# Patient Record
Sex: Female | Born: 1985 | Race: White | Hispanic: No | Marital: Married | Smoking: Never smoker
Health system: Southern US, Community
[De-identification: ages and names within clinical notes are randomized; demographics above are authoritative.]

## PROBLEM LIST (undated history)

## (undated) ENCOUNTER — Inpatient Hospital Stay: Payer: Self-pay

## (undated) DIAGNOSIS — Q613 Polycystic kidney, unspecified: Secondary | ICD-10-CM

## (undated) DIAGNOSIS — I10 Essential (primary) hypertension: Secondary | ICD-10-CM

## (undated) DIAGNOSIS — O24419 Gestational diabetes mellitus in pregnancy, unspecified control: Secondary | ICD-10-CM

## (undated) HISTORY — DX: Essential (primary) hypertension: I10

## (undated) HISTORY — DX: Polycystic kidney, unspecified: Q61.3

---

## 2007-02-22 ENCOUNTER — Emergency Department: Payer: Self-pay | Admitting: Emergency Medicine

## 2007-02-28 ENCOUNTER — Emergency Department: Payer: Self-pay | Admitting: Emergency Medicine

## 2009-07-03 IMAGING — CT CT STONE STUDY
1 of 2 series · 15 of 32 positions shown, 19 images · non-contrast
Comparison: none

REASON FOR EXAM: Abdominal pain
COMMENTS:

[Series 2: stone · axial · 0.71mm/px · z∈[-64,+374]mm · 15 of 164 slices shown, 19 images]
[im 12/164  soft-tissue]
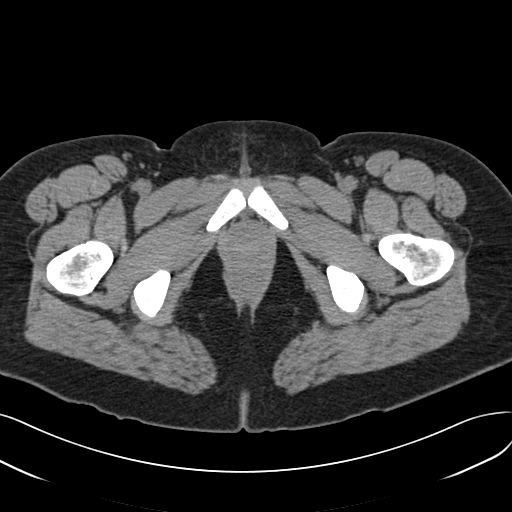
[im 12/164  bone]
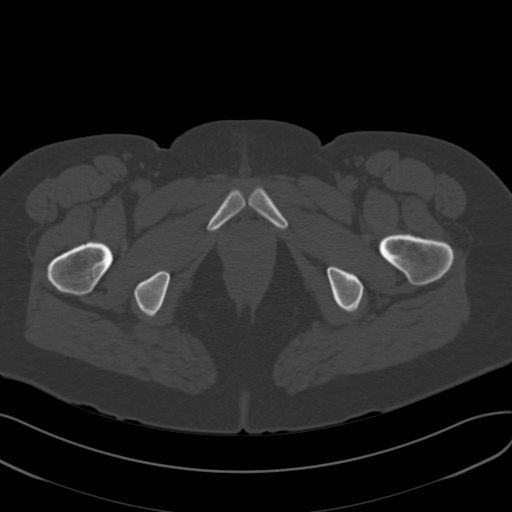
[im 23/164  soft-tissue]
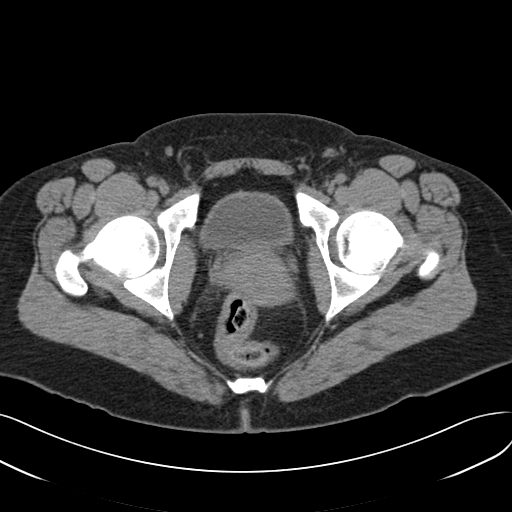
[im 34/164  soft-tissue]
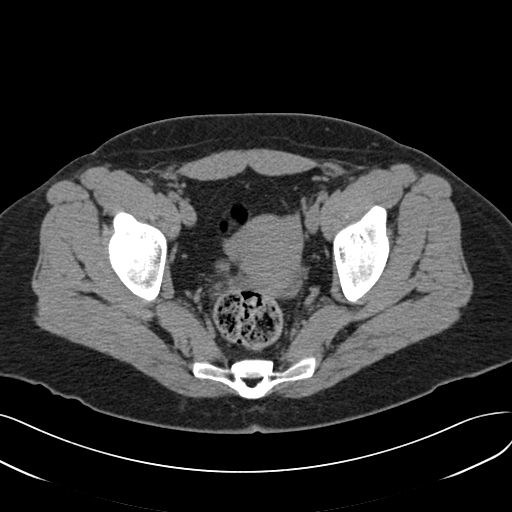
[im 45/164  soft-tissue]
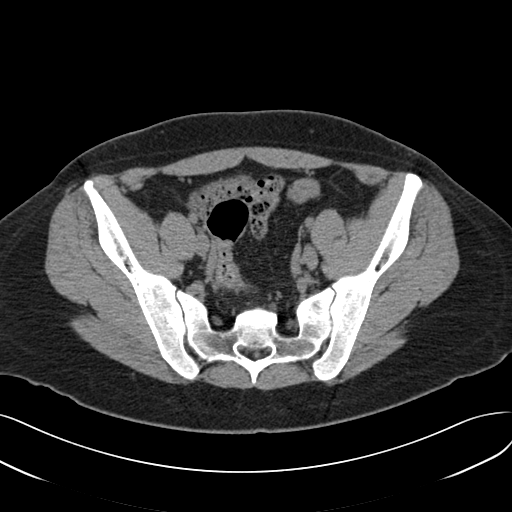
[im 57/164  soft-tissue]
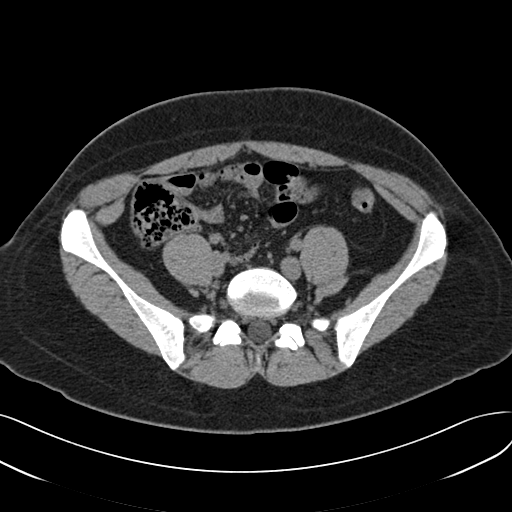
[im 68/164  soft-tissue]
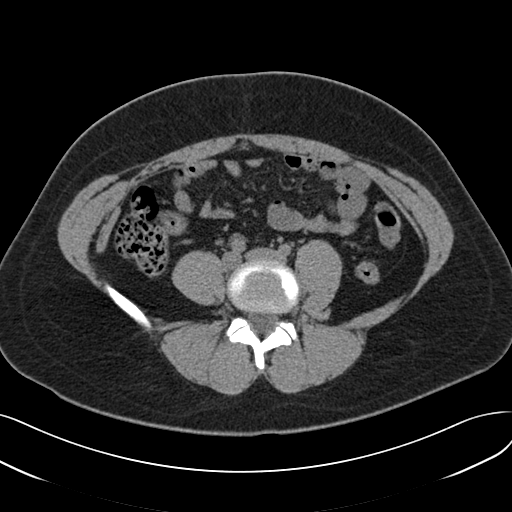
[im 85/164  soft-tissue]
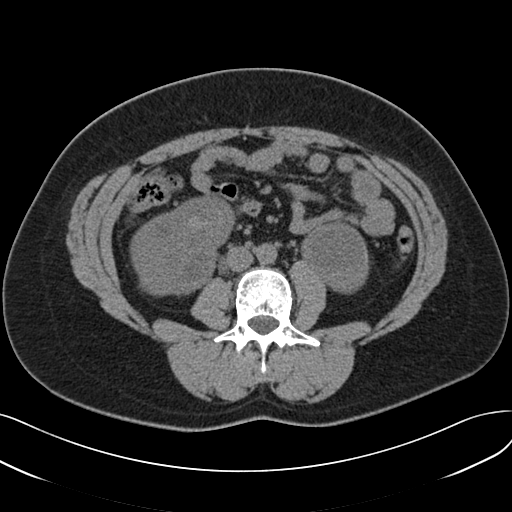
[im 96/164  soft-tissue]
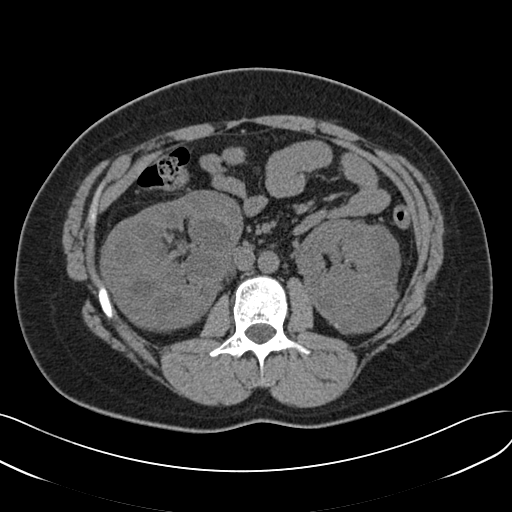
[im 107/164  soft-tissue]
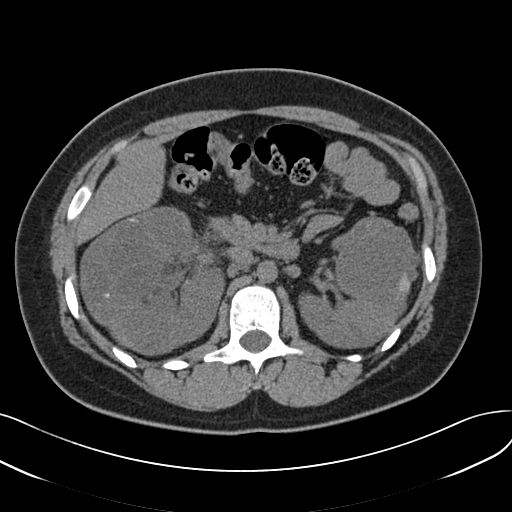
[im 107/164  bone]
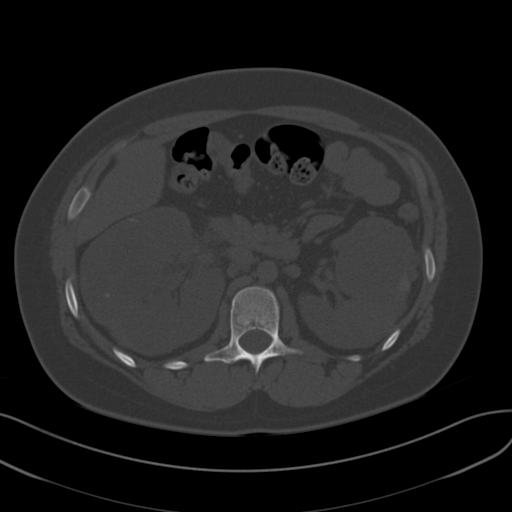
[im 119/164  soft-tissue]
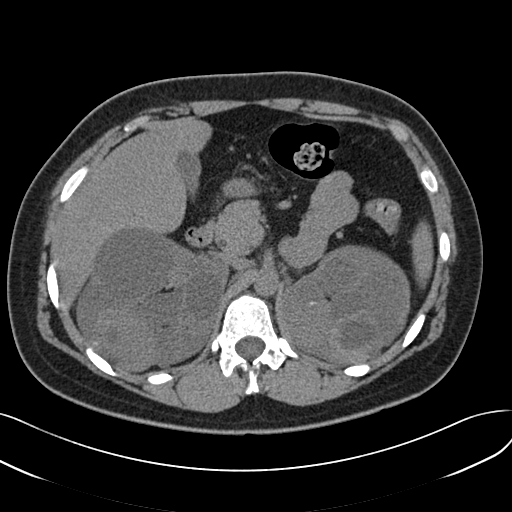
[im 130/164  soft-tissue]
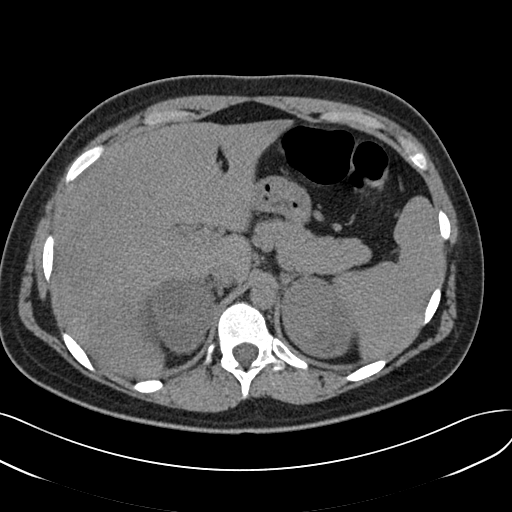
[im 141/164  soft-tissue]
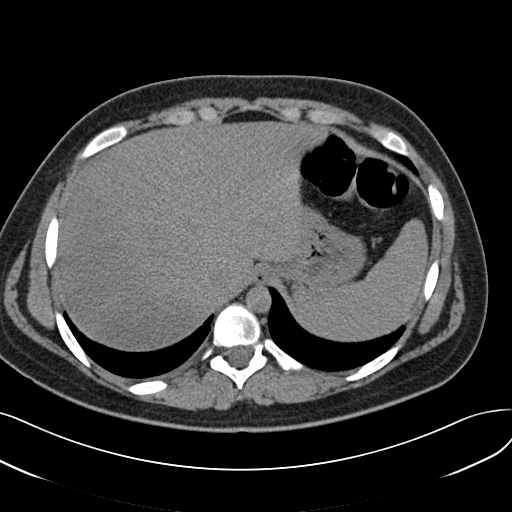
[im 141/164  lung]
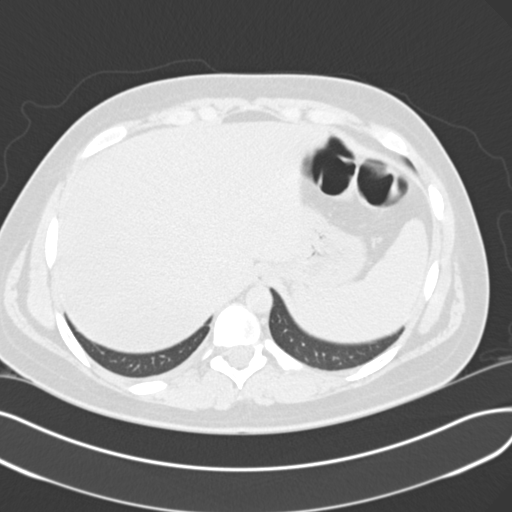
[im 147/164  lung]
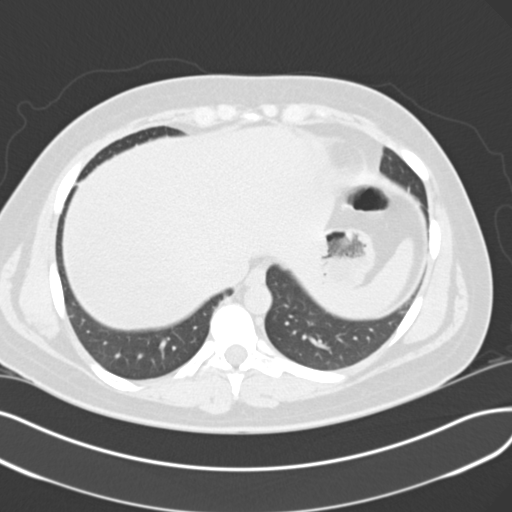
[im 152/164  soft-tissue]
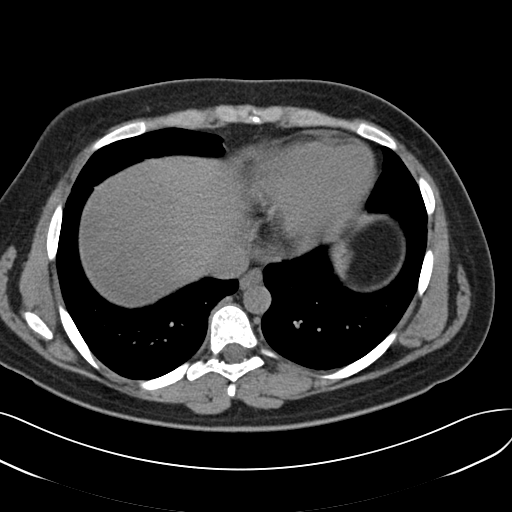
[im 152/164  lung]
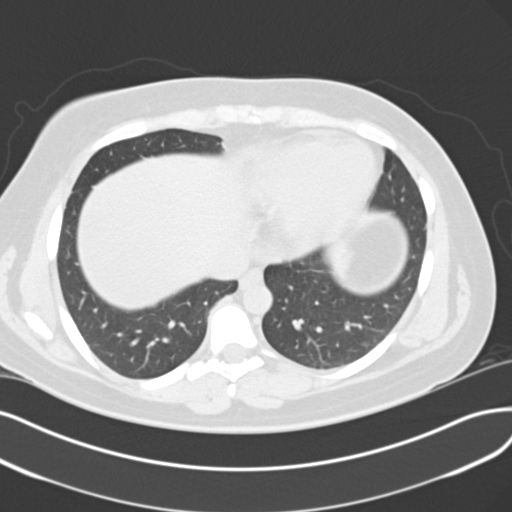
[im 158/164  lung]
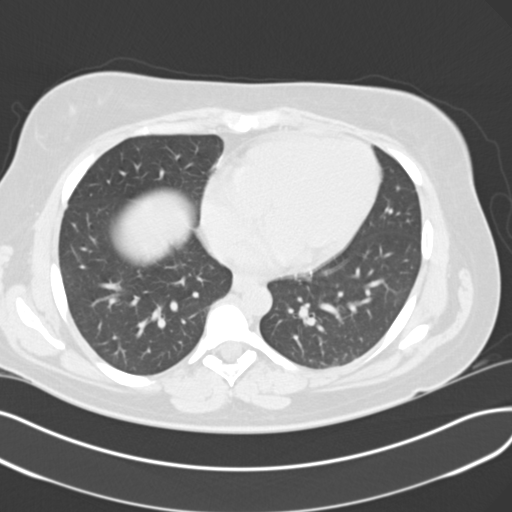

[15 of 32 positions shown; findings below may reference images not displayed]

PROCEDURE:     CT  - CT ABDOMEN /PELVIS WO (STONE)  - February 22, 2007  [DATE]

RESULT:     Emergent, noncontrast CT of the abdomen and pelvis is performed
utilizing a renal stone protocol. No IV or oral contrast is utilized. Images
are reconstructed at 3.0 mm slice thickness.

There are no prior studies available for comparison.
FINDINGS: Images through the lung bases demonstrate normal aeration. The
kidneys demonstrate an abnormal appearance consistent with enlargement
secondary to multiple presumed cysts. Some of the cysts contain thin
calcification within the walls. There appear to be some hyper-dense areas in
the RIGHT kidney suggestive of possible hemorrhagic cysts. Ultrasound or MRI
correlation would be recommended to evaluate polycystic kidney disease.
There is a normal appearance of the appendix. There may be mild RIGHT
hydroureter. There is a calcific density on image #135 which appears to be
in the distal RIGHT ureter and measures approximately 3.0 mm in diameter.
There does appear to be some RIGHT sided hydronephrosis as well. Multiple
mesenteric lymph nodes are noted on the RIGHT.  The largest of these
measures up to 1.3 cm in diameter as seen on image #89. No LEFT renal or
ureteral stones are evident. The abdominal viscera appear otherwise
unremarkable within the limitations of a noncontrast exam. There is no free
air, abscess or significant free fluid. The uterus is present and appears
unremarkable. No adnexal mass is appreciated.
IMPRESSION: RIGHT distal ureteral stone near the ureterovesical junction
measuring approximately 3.0 mm and causing mild hydroureter and
hydronephrosis on the RIGHT.  There are also changes of polycystic renal
disease with areas of hyper-dense appearance which could represent
hemorrhagic cysts. There appear to be areas of linear calcification
presumably within the walls of the cysts. There is no perinephric free
fluid. Some minimal perinephric stranding is present on the RIGHT.

## 2010-06-16 ENCOUNTER — Other Ambulatory Visit: Payer: Self-pay | Admitting: Obstetrics and Gynecology

## 2010-08-21 ENCOUNTER — Encounter: Payer: Self-pay | Admitting: Obstetrics & Gynecology

## 2011-01-15 ENCOUNTER — Observation Stay: Payer: Self-pay

## 2011-01-19 ENCOUNTER — Inpatient Hospital Stay: Payer: Self-pay | Admitting: Obstetrics and Gynecology

## 2015-07-10 LAB — HM PAP SMEAR: HM PAP: NEGATIVE

## 2016-01-07 ENCOUNTER — Encounter: Payer: Self-pay | Admitting: Dietician

## 2016-01-07 ENCOUNTER — Encounter: Payer: BC Managed Care – PPO | Attending: Obstetrics and Gynecology | Admitting: Dietician

## 2016-01-07 VITALS — BP 138/86 | Ht 64.0 in | Wt 227.5 lb

## 2016-01-07 DIAGNOSIS — O24419 Gestational diabetes mellitus in pregnancy, unspecified control: Secondary | ICD-10-CM | POA: Insufficient documentation

## 2016-01-07 DIAGNOSIS — Z3A00 Weeks of gestation of pregnancy not specified: Secondary | ICD-10-CM | POA: Diagnosis not present

## 2016-01-07 DIAGNOSIS — O24410 Gestational diabetes mellitus in pregnancy, diet controlled: Secondary | ICD-10-CM

## 2016-01-07 NOTE — Patient Instructions (Signed)
Read booklet on Gestational Diabetes Follow Gestational Meal Planning Guidelines Complete a 3 Day Food Record and bring to next appointment Check blood sugars 4 x day - before breakfast and 2 hrs after every meal and record  Bring blood sugar log to all appointments Walk 20-30 minutes at least 5 x week if permitted by MD Call if questions arise Next appointment  01-20-16

## 2016-01-07 NOTE — Progress Notes (Signed)
Appt. Start Time:1400 Appt. End Time: 1500  GDM Class 1 Diabetes Overview - define DM; state own type of DM; identify functions of pancreas and insulin; define insulin deficiency vs insulin resistance  Psychosocial - identify DM as a source of stress; state the effects of stress on BG control; verbalize appropriate stress management techniques; identify personal stress issues   Nutritional Management - describe effects of food on blood glucose; identify sources of carbohydrate, protein and fat; verbalize the importance of balance meals in controlling blood glucose; identify meals as well balanced or not; estimate servings of carbohydrate from menus; use food labels to identify servings size, content of carbohydrate, fiber, protein, fat, saturated fat and sodium; recognize food sources of fat, saturated fat, trans fat, sodium and verbalize goals for intake; describe healthful appropriate food choices when dining out   Exercise - describe the effects of exercise on blood glucose and importance of regular exercise in controlling diabetes; state a plan for personal exercise; verbalize contraindications for exercise  Medications - state name, dose, timing of currently prescribed medications; describe types of medications available for diabetes  Insulin Training - prepare and administer insulin accurately; state correct rotation pattern; verbalize safe and lawful needle disposal  Self-Monitoring - state importance of HBGM and demo procedure accurately; use HBGM results to effectively manage diabetes; identify importance of regular HbA1C testing and goals for results  Acute Complications/Sick Day Guidelines - recognize hyperglycemia and hypoglycemia with causes and effects; identify blood glucose results as high, low or in control; list steps in treating and preventing high and low blood glucose; state appropriate measure to manage blood glucose when ill (need for meds, HBGM plan, when to call physician,  need for fluids)  Chronic Complications/Foot, Skin, Eye Dental Care - identify possible long-term complications of diabetes (retinopathy, neuropathy, nephropathy, cardiovascular disease, infections); explain steps in prevention and treatment of chronic complications; state importance of daily self-foot exams; describe how to examine feet and what to look for; explain appropriate eye and dental care  Lifestyle Changes/Goals & Health/Community Resources - state benefits of making appropriate lifestyle changes; identify habits that need to change (meals, tobacco, alcohol); identify strategies to reduce risk factors for personal health; set goals for proper diabetes care; state need for and frequency of healthcare follow-up; describe appropriate community resources for good health (ADA, web sites, apps)   Pregnancy/Sexual Health - define gestational diabetes; state importance of good blood glucose control and birth control prior to pregnancy; state importance of good blood glucose control in preventing sexual problems (impotence, vaginal dryness, infections, loss of desire); state relationship of blood glucose control and pregnancy outcome; describe risk of maternal and fetal complications  Teaching Materials Used:  General Meal Planning Guidelines Daily Food Record Gestational Diabetes Booklet Gestational Video Goals for Healthy Pregnancy

## 2016-01-20 ENCOUNTER — Encounter: Payer: BC Managed Care – PPO | Attending: Obstetrics and Gynecology | Admitting: Dietician

## 2016-01-20 ENCOUNTER — Encounter: Payer: Self-pay | Admitting: Dietician

## 2016-01-20 VITALS — BP 120/70 | Ht 64.0 in | Wt 226.1 lb

## 2016-01-20 DIAGNOSIS — O24419 Gestational diabetes mellitus in pregnancy, unspecified control: Secondary | ICD-10-CM | POA: Diagnosis not present

## 2016-01-20 DIAGNOSIS — Z3A00 Weeks of gestation of pregnancy not specified: Secondary | ICD-10-CM | POA: Insufficient documentation

## 2016-01-20 DIAGNOSIS — O24410 Gestational diabetes mellitus in pregnancy, diet controlled: Secondary | ICD-10-CM

## 2016-01-20 NOTE — Progress Notes (Signed)
Diabetes Self-Management Education  Visit Type:  Follow-up  Appt. Start Time: 1325 Appt. End Time: 1415  01/20/2016  Ms. Alice MannHeather Harrell, identified by name and date of birth, is a 30 y.o. female with a diagnosis of Diabetes:  .   ASSESSMENT  Blood pressure 120/70, height 5\' 4"  (1.626 m), weight 226 lb 1.6 oz (102.6 kg), last menstrual period 06/03/2015. Body mass index is 38.81 kg/m.       Diabetes Self-Management Education - 01/20/16 1336      Complications   How often do you check your blood sugar? 3-4 times/day   Fasting Blood glucose range (mg/dL) 60-45470-129  02-8170-89   Postprandial Blood glucose range (mg/dL) 19-14770-129  [phone]; one reading of 128 when on vacation   Have you had a dilated eye exam in the past 12 months? No   Have you had a dental exam in the past 12 months? Yes   Are you checking your feet? N/A     Dietary Intake   Breakfast 3 meals and 1 snack daily, not hungry or in the habit of snacking during the day.      Exercise   Exercise Type Light (walking / raking leaves)  [place]   How many days per week to you exercise? 7   How many minutes per day do you exercise? 15   Total minutes per week of exercise 105     Patient Education   Nutrition management  Role of diet in the treatment of diabetes and the relationship between the three main macronutrients and blood glucose level;Food label reading, portion sizes and measuring food.;Meal options for control of blood glucose level and chronic complications.;Other (comment)   Monitoring Taught/discussed recording of test results and interpretation of SMBG.   Preconception care Pregnancy and GDM  Role of pre-pregnancy blood glucose control on the development of the fetus;Reviewed with patient blood glucose goals with pregnancy;Role of family planning for patients with diabetes     Post-Education Assessment   Patient understands the diabetes disease and treatment process.  Demonstrates understanding / competency   Patient understands incorporating nutritional management into lifestyle. Demonstrates understanding / competency   Patient undertands incorporating physical activity into lifestyle. Demonstrates understanding / competency   Patient understands using medications safely. Needs Review  not applicable at this time   Patient understands monitoring blood glucose, interpreting and using results Demonstrates understanding / competency   Patient understands prevention, detection, and treatment of acute complications. Needs Review   Patient understands prevention, detection, and treatment of chronic complications. Needs Review  not applicable at this time   Patient understands how to develop strategies to address psychosocial issues. Demonstrates understanding / competency   Patient understands how to develop strategies to promote health/change behavior. Demonstrates understanding / competency     Outcomes   Program Status Completed      Learning Objective:  Patient will have a greater understanding of diabetes self-management. Patient education plan is to attend individual and/or group sessions per assessed needs and concerns.   Plan:  Continue with current eating pattern, keep carbohydrate intake to 30-45g with breakfast, and 45g, but under 60g with lunch and supper.     Expected Outcomes:  Demonstrated interest in learning. Expect positive outcomes  Education material provided: Planning A Balanced Meal; Food Safety; Preventing Diabetes  If problems or questions, patient to contact team via:  Phone

## 2016-01-20 NOTE — Patient Instructions (Signed)
Continue with current eating pattern, keep carbohydrate intake to 30-45g with breakfast, and 45g, but under 60g with lunch and supper.

## 2016-03-02 ENCOUNTER — Encounter: Payer: Self-pay | Admitting: *Deleted

## 2016-03-02 ENCOUNTER — Inpatient Hospital Stay
Admission: EM | Admit: 2016-03-02 | Discharge: 2016-03-02 | Disposition: A | Discharge status: Home / Self Care | Payer: BC Managed Care – PPO | Source: Home / Self Care | Admitting: Obstetrics and Gynecology

## 2016-03-02 DIAGNOSIS — O24410 Gestational diabetes mellitus in pregnancy, diet controlled: Secondary | ICD-10-CM

## 2016-03-02 DIAGNOSIS — O26833 Pregnancy related renal disease, third trimester: Secondary | ICD-10-CM | POA: Insufficient documentation

## 2016-03-02 DIAGNOSIS — Z3A37 37 weeks gestation of pregnancy: Secondary | ICD-10-CM | POA: Insufficient documentation

## 2016-03-02 DIAGNOSIS — O34219 Maternal care for unspecified type scar from previous cesarean delivery: Secondary | ICD-10-CM | POA: Insufficient documentation

## 2016-03-02 DIAGNOSIS — Q613 Polycystic kidney, unspecified: Secondary | ICD-10-CM | POA: Insufficient documentation

## 2016-03-02 DIAGNOSIS — Z79899 Other long term (current) drug therapy: Secondary | ICD-10-CM

## 2016-03-02 DIAGNOSIS — O133 Gestational [pregnancy-induced] hypertension without significant proteinuria, third trimester: Secondary | ICD-10-CM

## 2016-03-02 HISTORY — DX: Gestational diabetes mellitus in pregnancy, unspecified control: O24.419

## 2016-03-02 NOTE — OB Triage Note (Signed)
patient sent over from office for nonreactive NST in office for monitoring

## 2016-03-02 NOTE — Discharge Instructions (Signed)
Drink plenty of fluid and get plenty of rest, call your provider for any other concerns °

## 2016-03-02 NOTE — Discharge Summary (Signed)
Patient discharged home, discharge instructions given, patient states understanding. Patient left floor in stable condition, denies any other needs at this time. Patient to keep next scheduled OB appointment 

## 2016-03-02 NOTE — Discharge Summary (Signed)
Physician Final Progress Note  Patient ID: Alice Harrell MRN: [id number] DOB/AGE: 23-Apr-1986 30 y.o.  Admit date: 03/02/2016 Admitting provider: Tresea Mall, CNM Discharge date: 03/02/2016   Admission Diagnoses: G2P1 at [redacted]w[redacted]d sent over from the office for extended monitoring due to non-reactive NST. Pt admits +fetal movement. She admits [employer]. She denies LOF, VB. She is on Labetalol for her hypertension and she is diet controlled gestational diabetic. She has a repeat C/S scheduled for 03/12/2016  Discharge Diagnoses:  Active Problems: IUP at [redacted]w[redacted]d with reactive NST, Gestational HTN on meds, Gestational DM diet controlled, hx of cesarean/section  Hospital Course: Patient was admitted for observation and placed on monitors  Past Medical History:  Diagnosis Date  . Gestational diabetes   . Hypertension   . Polycystic kidney disease     History reviewed. No pertinent surgical history.  No current facility-administered medications on file prior to encounter.    Current Outpatient Prescriptions on File Prior to Encounter  Medication Sig Dispense Refill  . labetalol (NORMODYNE) 100 MG tablet Take 100 mg by mouth 2 (two) times daily.     . Ferrous Sulfate (IRON) 325 (65 Fe) MG TABS Take 1 tablet by mouth daily.      No Known Allergies  Social History   Social History  . Marital status: Married    Spouse name: N/A  . Number of children: N/A  . Years of education: N/A   Occupational History  . Not on file.   Social History Main Topics  . Smoking status: Never Smoker  . Smokeless tobacco: Never Used  . Alcohol use No  . Drug use: No  . Sexual activity: Yes    Birth control/ protection: Pill   Other Topics Concern  . Not on file   Social History Narrative  . No narrative on file    Physical Exam: BP 138/76 (BP Location: Right Arm)   Pulse 80   Temp 98.2 F (36.8 C) (Oral)   Resp 18   LMP 06/03/2015 Comment: planning c-section end of  Sept 2017  Gen: NAD CV: RRR Pulm: CTAB Pelvic: deferred Toco: negative Fetal Well Being: 135 bpm, moderate variability, +accelerations, -decelerations Ext: no evidence of DVT  Consults: None  Significant Findings/ Diagnostic Studies: none  Procedures: NST  Discharge Condition: good  Disposition: Discharge to home  Diet: Regular diet  Discharge Activity: Activity as tolerated  Discharge Instructions    Discharge activity:  No Restrictions    Complete by:  As directed    Discharge diet:  No restrictions    Complete by:  As directed    Fetal Kick Count:  Lie on our left side for one hour after a meal, and count the number of times your baby kicks.  If it is less than 5 times, get up, move around and drink some juice.  Repeat the test 30 minutes later.  If it is still less than 5 kicks in an hour, notify your doctor.    Complete by:  As directed    LABOR:  When conractions begin, you should start to time them from the beginning of one contraction to the beginning  of the next.  When contractions are 5 - 10 minutes apart or less and have been regular for at least an hour, you should call your health care provider.    Complete by:  As directed    No sexual activity restrictions    Complete by:  As directed  Notify physician for bleeding from the vagina    Complete by:  As directed    Notify physician for blurring of vision or spots before the eyes    Complete by:  As directed    Notify physician for chills or fever    Complete by:  As directed    Notify physician for fainting spells, "black outs" or loss of consciousness    Complete by:  As directed    Notify physician for increase in vaginal discharge    Complete by:  As directed    Notify physician for leaking of fluid    Complete by:  As directed    Notify physician for pain or burning when urinating    Complete by:  As directed    Notify physician for pelvic pressure (sudden increase)    Complete by:  As directed     Notify physician for severe or continued nausea or vomiting    Complete by:  As directed    Notify physician for sudden gushing of fluid from the vagina (with or without continued leaking)    Complete by:  As directed    Notify physician for sudden, constant, or occasional abdominal pain    Complete by:  As directed    Notify physician if baby moving less than usual    Complete by:  As directed        Medication List    TAKE these medications   Iron 325 (65 Fe) MG Tabs Take 1 tablet by mouth daily.   labetalol 100 MG tablet Commonly known as:  NORMODYNE Take 100 mg by mouth 2 (two) times daily.      [facility] Information    [facility] [place] [facility]   Why:  go to regular scheduled prenatal appointment Contact information: [address] [place] [id number] [phone]           Total time spent taking care of this patient: 20 minutes  Signed: Tresea MallGLEDHILL,Alice Harrell, CNM  03/02/2016, 1:33 PM

## 2016-03-04 ENCOUNTER — Encounter
Admission: EM | Disposition: A | Discharge status: Home / Self Care | Payer: Self-pay | Source: Home / Self Care | Attending: Obstetrics & Gynecology

## 2016-03-04 ENCOUNTER — Inpatient Hospital Stay: Payer: BC Managed Care – PPO | Admitting: Anesthesiology

## 2016-03-04 ENCOUNTER — Inpatient Hospital Stay
Admission: EM | Admit: 2016-03-04 | Discharge: 2016-03-07 | DRG: 765 | Disposition: A | Discharge status: Home / Self Care | Payer: BC Managed Care – PPO | Attending: Obstetrics & Gynecology | Admitting: Obstetrics & Gynecology

## 2016-03-04 DIAGNOSIS — O24419 Gestational diabetes mellitus in pregnancy, unspecified control: Secondary | ICD-10-CM | POA: Diagnosis present

## 2016-03-04 DIAGNOSIS — O9902 Anemia complicating childbirth: Secondary | ICD-10-CM | POA: Diagnosis present

## 2016-03-04 DIAGNOSIS — O34211 Maternal care for low transverse scar from previous cesarean delivery: Secondary | ICD-10-CM | POA: Diagnosis present

## 2016-03-04 DIAGNOSIS — Z6838 Body mass index (BMI) 38.0-38.9, adult: Secondary | ICD-10-CM | POA: Diagnosis not present

## 2016-03-04 DIAGNOSIS — Q613 Polycystic kidney, unspecified: Secondary | ICD-10-CM

## 2016-03-04 DIAGNOSIS — O114 Pre-existing hypertension with pre-eclampsia, complicating childbirth: Secondary | ICD-10-CM | POA: Diagnosis present

## 2016-03-04 DIAGNOSIS — D62 Acute posthemorrhagic anemia: Secondary | ICD-10-CM | POA: Diagnosis not present

## 2016-03-04 DIAGNOSIS — Z98891 History of uterine scar from previous surgery: Secondary | ICD-10-CM

## 2016-03-04 DIAGNOSIS — O1490 Unspecified pre-eclampsia, unspecified trimester: Secondary | ICD-10-CM | POA: Diagnosis present

## 2016-03-04 DIAGNOSIS — Z3A37 37 weeks gestation of pregnancy: Secondary | ICD-10-CM | POA: Diagnosis not present

## 2016-03-04 DIAGNOSIS — O24420 Gestational diabetes mellitus in childbirth, diet controlled: Secondary | ICD-10-CM | POA: Diagnosis present

## 2016-03-04 DIAGNOSIS — D696 Thrombocytopenia, unspecified: Secondary | ICD-10-CM | POA: Diagnosis present

## 2016-03-04 DIAGNOSIS — E669 Obesity, unspecified: Secondary | ICD-10-CM | POA: Diagnosis present

## 2016-03-04 DIAGNOSIS — O99213 Obesity complicating pregnancy, third trimester: Secondary | ICD-10-CM | POA: Diagnosis present

## 2016-03-04 DIAGNOSIS — O139 Gestational [pregnancy-induced] hypertension without significant proteinuria, unspecified trimester: Secondary | ICD-10-CM

## 2016-03-04 LAB — COMPREHENSIVE METABOLIC PANEL
ALK PHOS: 106 U/L (ref 38–126)
ALT: 10 U/L — ABNORMAL LOW (ref 14–54)
ANION GAP: 9 (ref 5–15)
AST: 15 U/L (ref 15–41)
Albumin: 3 g/dL — ABNORMAL LOW (ref 3.5–5.0)
BILIRUBIN TOTAL: 0.5 mg/dL (ref 0.3–1.2)
BUN: 14 mg/dL (ref 6–20)
CALCIUM: 9.2 mg/dL (ref 8.9–10.3)
CO2: 18 mmol/L — ABNORMAL LOW (ref 22–32)
Chloride: 109 mmol/L (ref 101–111)
Creatinine, Ser: 0.97 mg/dL (ref 0.44–1.00)
GFR calc non Af Amer: >60 mL/min (ref >60)
GLUCOSE: 64 mg/dL — AB (ref 65–99)
POTASSIUM: 3.9 mmol/L (ref 3.5–5.1)
Sodium: 136 mmol/L (ref 135–145)
TOTAL PROTEIN: 6.8 g/dL (ref 6.5–8.1)

## 2016-03-04 LAB — PROTEIN / CREATININE RATIO, URINE
Creatinine, Urine: 85 mg/dL
Protein Creatinine Ratio: 0.33 mg/mg{Cre} — ABNORMAL HIGH (ref 0.00–0.15)
Total Protein, Urine: 28 mg/dL

## 2016-03-04 LAB — CBC
HEMATOCRIT: 35 % (ref 35.0–47.0)
HEMOGLOBIN: 12 g/dL (ref 12.0–16.0)
MCH: 28.7 pg (ref 26.0–34.0)
MCHC: 34.3 g/dL (ref 32.0–36.0)
MCV: 83.4 fL (ref 80.0–100.0)
Platelets: 117 10*3/uL — ABNORMAL LOW (ref 150–440)
RBC: 4.2 MIL/uL (ref 3.80–5.20)
RDW: 16.2 % — AB (ref 11.5–14.5)
WBC: 10.9 10*3/uL (ref 3.6–11.0)

## 2016-03-04 LAB — TYPE AND SCREEN
ABO/RH(D): A POS
ANTIBODY SCREEN: NEGATIVE

## 2016-03-04 LAB — SAMPLE TO BLOOD BANK

## 2016-03-04 SURGERY — Surgical Case
Anesthesia: Spinal | Site: Abdomen | Wound class: Clean Contaminated

## 2016-03-04 MED ORDER — KETOROLAC TROMETHAMINE 30 MG/ML IJ SOLN: 30.0000 mg | Freq: Four times a day (QID) | INTRAMUSCULAR | Status: DC | PRN

## 2016-03-04 MED ORDER — CEFOXITIN SODIUM-DEXTROSE 2-2.2 GM-% IV SOLR (PREMIX): 2.0000 g | INTRAVENOUS | Status: DC

## 2016-03-04 MED ORDER — CEFAZOLIN SODIUM-DEXTROSE 2-4 GM/100ML-% IV SOLN
INTRAVENOUS | Status: AC
Start: 2016-03-04 — End: 2016-03-05
  Filled 2016-03-04: qty 100

## 2016-03-04 MED ORDER — LACTATED RINGERS IV SOLN
INTRAVENOUS | Status: DC | PRN
Start: 2016-03-04 — End: 2016-03-04
  Administered 2016-03-04 (×2): via INTRAVENOUS

## 2016-03-04 MED ORDER — NALBUPHINE HCL 10 MG/ML IJ SOLN: 5.0000 mg | INTRAMUSCULAR | Status: DC | PRN

## 2016-03-04 MED ORDER — CEFOXITIN SODIUM-DEXTROSE 2-2.2 GM-% IV SOLR (PREMIX)
INTRAVENOUS | Status: AC
  Filled 2016-03-04: qty 50

## 2016-03-04 MED ORDER — FENTANYL CITRATE (PF) 100 MCG/2ML IJ SOLN: 25.0000 ug | INTRAMUSCULAR | Status: DC | PRN

## 2016-03-04 MED ORDER — ONDANSETRON HCL 4 MG/2ML IJ SOLN
INTRAMUSCULAR | Status: DC | PRN
  Administered 2016-03-04: 4 mg via INTRAVENOUS

## 2016-03-04 MED ORDER — CEFOXITIN SODIUM-DEXTROSE 2-2.2 GM-% IV SOLR (PREMIX)
INTRAVENOUS | Status: AC
  Administered 2016-03-04: 2000 mg
  Filled 2016-03-04: qty 50

## 2016-03-04 MED ORDER — BUPIVACAINE HCL 0.5 % IJ SOLN
10.0000 mL | Freq: Once | INTRAMUSCULAR | Status: DC
  Filled 2016-03-04: qty 10

## 2016-03-04 MED ORDER — SOD CITRATE-CITRIC ACID 500-334 MG/5ML PO SOLN
ORAL | Status: AC
  Administered 2016-03-04: 30 mL
  Filled 2016-03-04: qty 15

## 2016-03-04 MED ORDER — BUPIVACAINE 0.25 % ON-Q PUMP DUAL CATH 400 ML
400.0000 mL | INJECTION | Status: DC
  Filled 2016-03-04: qty 400

## 2016-03-04 MED ORDER — NALOXONE HCL 0.4 MG/ML IJ SOLN: 0.4000 mg | INTRAMUSCULAR | Status: DC | PRN

## 2016-03-04 MED ORDER — ONDANSETRON HCL 4 MG/2ML IJ SOLN: 4.0000 mg | Freq: Three times a day (TID) | INTRAMUSCULAR | Status: DC | PRN

## 2016-03-04 MED ORDER — LACTATED RINGERS IV SOLN: INTRAVENOUS | Status: DC

## 2016-03-04 MED ORDER — EPHEDRINE SULFATE 50 MG/ML IJ SOLN
INTRAMUSCULAR | Status: DC | PRN
  Administered 2016-03-04 (×2): 10 mg via INTRAVENOUS

## 2016-03-04 MED ORDER — OXYCODONE HCL 5 MG PO TABS
5.0000 mg | ORAL_TABLET | ORAL | Status: DC | PRN
  Administered 2016-03-05: 5 mg via ORAL
  Filled 2016-03-04: qty 1

## 2016-03-04 MED ORDER — MORPHINE SULFATE (PF) 0.5 MG/ML IJ SOLN
INTRAMUSCULAR | Status: DC | PRN
  Administered 2016-03-04: .1 mg via INTRATHECAL

## 2016-03-04 MED ORDER — BUPIVACAINE HCL (PF) 0.5 % IJ SOLN
INTRAMUSCULAR | Status: DC | PRN
  Administered 2016-03-04: 10 mL

## 2016-03-04 MED ORDER — SOD CITRATE-CITRIC ACID 500-334 MG/5ML PO SOLN: 30.0000 mL | ORAL | Status: DC

## 2016-03-04 MED ORDER — MEPERIDINE HCL 25 MG/ML IJ SOLN: 6.2500 mg | INTRAMUSCULAR | Status: DC | PRN

## 2016-03-04 MED ORDER — OXYTOCIN 40 UNITS IN LACTATED RINGERS INFUSION - SIMPLE MED
INTRAVENOUS | Status: DC | PRN
  Administered 2016-03-04: 399 mL via INTRAVENOUS
  Administered 2016-03-04: 1 mL via INTRAVENOUS

## 2016-03-04 MED ORDER — SODIUM CHLORIDE 0.9% FLUSH: 3.0000 mL | INTRAVENOUS | Status: DC | PRN

## 2016-03-04 MED ORDER — BUPIVACAINE HCL (PF) 0.5 % IJ SOLN
INTRAMUSCULAR | Status: AC
  Filled 2016-03-04: qty 30

## 2016-03-04 MED ORDER — KETOROLAC TROMETHAMINE 30 MG/ML IJ SOLN
30.0000 mg | Freq: Four times a day (QID) | INTRAMUSCULAR | Status: DC | PRN
  Administered 2016-03-05 (×2): 30 mg via INTRAVENOUS
  Filled 2016-03-04 (×2): qty 1

## 2016-03-04 MED ORDER — OXYTOCIN 40 UNITS IN LACTATED RINGERS INFUSION - SIMPLE MED
2.5000 [IU]/h | INTRAVENOUS | Status: DC
  Administered 2016-03-04 – 2016-03-05 (×2): 2.5 [IU]/h via INTRAVENOUS

## 2016-03-04 MED ORDER — OXYTOCIN 40 UNITS IN LACTATED RINGERS INFUSION - SIMPLE MED
INTRAVENOUS | Status: AC
  Filled 2016-03-04: qty 1000

## 2016-03-04 MED ORDER — NALOXONE HCL 2 MG/2ML IJ SOSY: 1.0000 ug/kg/h | PREFILLED_SYRINGE | INTRAVENOUS | Status: DC | PRN

## 2016-03-04 MED ORDER — ONDANSETRON HCL 4 MG/2ML IJ SOLN: 4.0000 mg | Freq: Once | INTRAMUSCULAR | Status: DC | PRN

## 2016-03-04 MED ORDER — FENTANYL CITRATE (PF) 100 MCG/2ML IJ SOLN
INTRAMUSCULAR | Status: DC | PRN
  Administered 2016-03-04: 15 ug via INTRATHECAL

## 2016-03-04 MED ORDER — BUPIVACAINE IN DEXTROSE 0.75-8.25 % IT SOLN
INTRATHECAL | Status: DC | PRN
  Administered 2016-03-04: 1.6 mL via INTRATHECAL

## 2016-03-04 MED ORDER — NALBUPHINE HCL 10 MG/ML IJ SOLN: 5.0000 mg | Freq: Once | INTRAMUSCULAR | Status: DC | PRN

## 2016-03-04 SURGICAL SUPPLY — 23 items
CANISTER SUCT 3000ML (MISCELLANEOUS) ×3 IMPLANT
CATH KIT ON-Q SILVERSOAK 5IN (CATHETERS) ×6 IMPLANT
CHLORAPREP W/TINT 26ML (MISCELLANEOUS) ×6 IMPLANT
ELECT CAUTERY BLADE 6.4 (BLADE) ×3 IMPLANT
ELECT REM PT RETURN 9FT ADLT (ELECTROSURGICAL) ×3
ELECTRODE REM PT RTRN 9FT ADLT (ELECTROSURGICAL) ×1 IMPLANT
GLOVE SKINSENSE NS SZ8.0 LF (GLOVE) ×18
GLOVE SKINSENSE STRL SZ8.0 LF (GLOVE) ×9 IMPLANT
GOWN STRL REUS W/ TWL LRG LVL3 (GOWN DISPOSABLE) ×3 IMPLANT
GOWN STRL REUS W/ TWL XL LVL3 (GOWN DISPOSABLE) ×2 IMPLANT
GOWN STRL REUS W/TWL LRG LVL3 (GOWN DISPOSABLE) ×6
GOWN STRL REUS W/TWL XL LVL3 (GOWN DISPOSABLE) ×4
LIQUID BAND (GAUZE/BANDAGES/DRESSINGS) ×3 IMPLANT
NS IRRIG 1000ML POUR BTL (IV SOLUTION) ×3 IMPLANT
PACK C SECTION AR (MISCELLANEOUS) ×3 IMPLANT
PAD OB MATERNITY 4.3X12.25 (PERSONAL CARE ITEMS) ×3 IMPLANT
PAD PREP 24X41 OB/GYN DISP (PERSONAL CARE ITEMS) IMPLANT
SUT MAXON ABS #0 GS21 30IN (SUTURE) ×3 IMPLANT
SUT VIC AB 0 CTX 36 (SUTURE) ×4
SUT VIC AB 0 CTX36XBRD ANBCTRL (SUTURE) ×2 IMPLANT
SUT VIC AB 1 CT1 36 (SUTURE) ×3 IMPLANT
SUT VIC AB 2-0 CT1 36 (SUTURE) IMPLANT
SUT VIC AB 4-0 FS2 27 (SUTURE) IMPLANT

## 2016-03-04 NOTE — Anesthesia Procedure Notes (Signed)
Spinal  Patient location during procedure: OB Start time: 03/04/2016 10:25 PM End time: 03/04/2016 10:27 PM Staffing Anesthesiologist: Lenard SimmerKARENZ, ANDREW Performed: anesthesiologist  Preanesthetic Checklist Completed: patient identified, site marked, surgical consent, pre-op evaluation, timeout performed, IV checked, risks and benefits discussed and monitors and equipment checked Spinal Block Patient position: sitting Prep: ChloraPrep Patient monitoring: heart rate, continuous pulse ox and blood pressure Approach: midline Location: L3-4 Injection technique: single-shot Needle Needle type: Whitacre and Introducer  Needle gauge: 24 G Needle length: 9 cm Assessment Sensory level: T4

## 2016-03-04 NOTE — Transfer of Care (Signed)
Immediate Anesthesia Transfer of Care Note  Patient: Alice CrankerHeather J Harrell  Procedure(s) Performed: Procedure(s): CESAREAN SECTION (N/A)  Patient Location: PACU  Anesthesia Type:Spinal  Level of Consciousness: awake, alert , oriented and patient cooperative  Airway & Oxygen Therapy: Patient Spontanous Breathing  Post-op Assessment: Report given to RN and Post -op Vital signs reviewed and stable  Post vital signs: Reviewed and stable  Last Vitals:  Vitals:   03/04/16 2335  BP: (!) 142/72  Pulse: 71  Resp: 18    Last Pain:  Vitals:   03/04/16 2335  PainSc: 0-No pain         Complications: No apparent anesthesia complications

## 2016-03-04 NOTE — Anesthesia Preprocedure Evaluation (Signed)
Anesthesia Evaluation  Patient identified by MRN, date of birth, ID band Patient awake    Reviewed: Allergy & Precautions, H&P , NPO status , Patient's Chart, lab work & pertinent test results, reviewed documented beta blocker date and time   History of Anesthesia Complications Negative for: history of anesthetic complications  Airway Mallampati: III  TM Distance: >3 FB Neck ROM: full    Dental no notable dental hx. (+) Teeth Intact   Pulmonary neg pulmonary ROS,    Pulmonary exam normal breath sounds clear to auscultation       Cardiovascular Exercise Tolerance: Good hypertension, On Medications (-) angina(-) CAD, (-) Past MI, (-) Cardiac Stents and (-) CABG Normal cardiovascular exam(-) dysrhythmias (-) Valvular Problems/Murmurs Rhythm:regular Rate:Normal     Neuro/Psych negative neurological ROS  negative psych ROS   GI/Hepatic negative GI ROS, Neg liver ROS,   Endo/Other  diabetes, Well Controlled  Renal/GU Renal disease (PCKD)  negative genitourinary   Musculoskeletal   Abdominal   Peds  Hematology negative hematology ROS (+)   Anesthesia Other Findings Past Medical History: No date: Gestational diabetes No date: Hypertension No date: Polycystic kidney disease   Reproductive/Obstetrics (+) Pregnancy                             Anesthesia Physical Anesthesia Plan  ASA: III  Anesthesia Plan: Spinal   Post-op Pain Management:    Induction:   Airway Management Planned:   Additional Equipment:   Intra-op Plan:   Post-operative Plan:   Informed Consent: I have reviewed the patients History and Physical, chart, labs and discussed the procedure including the risks, benefits and alternatives for the proposed anesthesia with the patient or authorized representative who has indicated his/her understanding and acceptance.   Dental Advisory Given  Plan Discussed with:  Anesthesiologist, CRNA and Surgeon  Anesthesia Plan Comments:         Anesthesia Quick Evaluation

## 2016-03-04 NOTE — Op Note (Signed)
Preoperative Diagnosis: 1) 30 y.o. G2P1001 38 weeks 5 days gestation 2) Superimposed preeclampsia (worsening BP's and dropping platelets)  3) Gestational diabetes  Postoperative Diagnosis: 1) 30 y.o. G2P1001 38 weeks 5 days gestation 2) Superimposed preeclampsia (worsening BP's and dropping platelets)  3) Gestational diabetes  Operation Performed: Repeat low transverse C-section via pfannenstiel skin incision  Indiciation: Superimposed preeclampsia by worsening BP's and thrombocytopenia, P/C ratio 303  Anesthesia: Spinal  Primary Surgeon: Vena Austria, MD   Assistant:Robert Tiburcio Pea, MD  Preoperative Antibiotics: 2g Ancef  Estimated Blood Loss:  IV Fluids:  Urine Output::  Drains or Tubes: Foley to gravity drainage, ON-Q catheter system  Implants: none  Specimens Removed: none  Complications: none  Intraoperative Findings:  Normal tubes ovaries and uterus.  Delivery resulted in the birth of a liveborn female, APGAR (1 MIN): 9, APGAR (5 MINS): 9, weight pending  Patient Condition:stable  Procedure in Detail:  Patient was taken to the operating room were she was administered regional anesthesia.  She was positioned in the supine position, prepped and draped in the  Usual sterile fashion.  Prior to proceeding with the case a time out was performed and the level of anesthetic was checked and noted to be adequate.  Utilizing the scalpel a pfannenstiel skin incision was made 2cm above the pubic symphysis utilizing the patient's pre-existing scar and carried down sharply to the the level of the rectus fascia.  The fascia was incised in the midline using the scalpel and then extended using mayo scissors.  The superior border of the rectus fascia was grasped with two Kocher clamps and the underlying rectus muscles were dissected of the fascia using blunt dissection.  The median raphae was incised using Mayo scissors.   The inferior border of the rectus fascia was  dissected of the rectus muscles in a similar fashion.  The midline was identified, the peritoneum was entered bluntly and expanded using manual tractions.  The uterus was noted to be in a none rotated position.  Next the bladder blade was placed retracting the bladder caudally.  A bladder flap was not created.  A low transverse incision was scored on the lower uterine segment.  The hysterotomy was entered bluntly using the operators finger.  The hysterotomy incision was extended using manual traction.  The operators hand was placed within the hysterotomy position noting the fetus to be within the OA position.  The vertex was grasped, flexed, brought to the incision, and delivered a traumatically using fundal pressure.  The remainder of the body delivered with ease.  The infant was suctioned, cord was clamped and cut before handing off to the awaiting neonatologist.  The placenta was delivered using manual extraction.  The uterus was exteriorized, wiped clean of clots and debris using two moist laps.  The hysterotomy was closed using a two layer closure of 0 Vicryl, with the first being a running locked, the second a horizontal imbricating.  The uterus was returned to the abdomen.  The peritoneal gutters were wiped clean of clots and debris using two moist laps.  The hysterotomy incision was re-inspected noted to be hemostatic.  TThe rectus muscles were inspected noted to be hemostatic.  The superior border of the rectus fascia was grasped with a Kocher clamp.  The ON-Q trocars were then placed 4cm above the superior border of the incision and tunneled subfascially.  The introducers were removed and the catheters were threaded through the sleeves after which the sleeves were removed.  The fascia was closed using a looped #1 PDS in a running fashion taking 1cm by 1cm bites.  The subcutaneous tissue was irrigated using warm saline, hemostasis achieved using the bovie.  The subcutaneous dead space was less than 3cm  and was not closed.  The sking was closed using 4-0 Monocryl in a subcuticular fashion.  Sponge needle and instrument counts were corrects times two.  The patient tolerated the procedure well and was taken to the recovery room in stable condition.

## 2016-03-04 NOTE — Progress Notes (Signed)
  Labor Progress Note   30 y.o. G2P1001 @ 632w5d , admitted for  Pregnancy, Labor Management.   Subjective:  Headache and HTN, Labs c/w preclampsia (mild), low platelet (117)  Objective:  BPs 130-150s/80s Abd gravid, NT Extr min edema A NST procedure was performed with FHR monitoring and a normal baseline established, appropriate time of 20-40 minutes of evaluation, and accels >2 seen w 15x15 characteristics.  Results show a REACTIVE NST.   Labs:  Current lab results:  Recent Labs  03/04/16 1657  WBC 10.9  HGB 12.0  HCT 35.0  PLT 117 LFT normal Urine Prot/Cr ratio330  Assessment & Plan:  G2P1001 @ 3632w5d, admitted for chronic hypertension with superimposed preeclampsia, now with thrombocytopenia Pt has had headache today, although better now. Options for delivery vs exp mgt discussed, and due to above diagnosis plan for delivery by CS (repeat) has been made. Risks of preeclampsia and for CS are discussed with the patient and family.

## 2016-03-04 NOTE — OB Triage Note (Signed)
Pt presents to l/d with c/o BP being elevated at work. Taken by school nurse. Pt states she does have a headache. Has been teaching at school all day. Urine appears concentrated. [facility]

## 2016-03-04 NOTE — H&P (Signed)
Obstetrics Admission History & Physical   Hypertension   HPI:  30 y.o. G2P1001 @ 3668w5d (03/20/2016, Date entered prior to episode creation). Admitted on 03/04/2016:   Patient Active Problem List   Diagnosis Date Noted  . Gestational hypertension 03/04/2016  . Gestational diabetes 03/04/2016  . History of cesarean delivery 03/04/2016  . 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  Denies SOB, CP, worsening edema, RUQ pain, or visual changes.  BPs at work were 140s-150s/80-90s.  She has cHTN on Labetalol this pregnancy and usually runs 130s/80s.   P[facility]atal [facility] at: at [facility]  PMHx:  Past Medical History:  Diagnosis Date  . Gestational diabetes   . Hypertension   . Polycystic kidney disease    PSHx: No past surgical history on file. Medications:  Prescriptions Prior to Admission  Medication Sig Dispense Refill Last Dose  . Ferrous Sulfate (IRON) 325 (65 Fe) MG TABS Take 1 tablet by mouth daily.   03/04/2016  . labetalol (NORMODYNE) 100 MG tablet Take 100 mg by mouth 2 (two) times daily.    03/04/2016   Allergies: has No Known Allergies. OBHx:  OB History  Gravida Para Term Preterm AB Living  2 1 1     1   SAB TAB Ectopic Multiple Live Births          1    # Outcome Date GA Lbr Len/2nd Weight Sex Delivery Anes PTL Lv  2 Current           1 Term 01/21/11    F CS-LTranv   LIV     KGM:WNUUVOZD/GUYQIHKVQQVZFHx:Negative/unremarkable except as detailed in HPI. Soc Hx: Never smoker, Alcohol: none, Recreational drug use: none and Pregnancy welcomed  Objective:  There were no vitals filed for this visit. General: Well nourished, well developed female in no acute distress.  Skin: Warm and dry.  Cardiovascular:Regular rate and rhythm. Respiratory: Clear to auscultation bilateral. Normal respiratory effort Abdomen: mild Neuro/Psych: Normal mood and affect.  EFM:FHR:  140 bpm, variability: minimal ,  accelerations:  Present,  decelerations:  Absent Toco: Frequency: Every 15 minutes   Perinatal info:  Blood type: A positive Rubella- Immune Varicella -Immune TDaP Given during third trimester of this pregnancy RPR NR / HIV Neg/ HBsAg Neg   Assessment & Plan:   30 y.o. G2P1001 @ 1468w5d, Admitted on 03/04/2016: cHTN with worsening BPs at work today. Headache mild, resolving.  No other s/sx preeclampsia.  Labs. Rest.  May need to stop working rest of pregnancy (CS scheduled next week). CS pros and cons discussed, and with cHTN and superimposed gestational HTN, delivery anytime now (>37 weeks) is appropriate and safe.

## 2016-03-04 NOTE — Plan of Care (Signed)
Dr Tiburcio Peaharris in to discuss plan of care with pt. Pt scheduled for repeat c section next Thursday.

## 2016-03-05 ENCOUNTER — Encounter: Payer: Self-pay | Admitting: Obstetrics & Gynecology

## 2016-03-05 LAB — CBC
HCT: 33.4 % — ABNORMAL LOW (ref 35.0–47.0)
HEMOGLOBIN: 11.1 g/dL — AB (ref 12.0–16.0)
MCH: 28 pg (ref 26.0–34.0)
MCHC: 33.2 g/dL (ref 32.0–36.0)
MCV: 84.5 fL (ref 80.0–100.0)
PLATELETS: 115 10*3/uL — AB (ref 150–440)
RBC: 3.96 MIL/uL (ref 3.80–5.20)
RDW: 16.2 % — ABNORMAL HIGH (ref 11.5–14.5)
WBC: 12.1 10*3/uL — ABNORMAL HIGH (ref 3.6–11.0)

## 2016-03-05 LAB — RPR: RPR: NONREACTIVE

## 2016-03-05 MED ORDER — DIBUCAINE 1 % RE OINT: 1.0000 "application " | TOPICAL_OINTMENT | RECTAL | Status: DC | PRN

## 2016-03-05 MED ORDER — ACETAMINOPHEN 325 MG PO TABS
650.0000 mg | ORAL_TABLET | ORAL | Status: DC | PRN
Start: 2016-03-05 — End: 2016-03-07

## 2016-03-05 MED ORDER — SENNOSIDES-DOCUSATE SODIUM 8.6-50 MG PO TABS
2.0000 | ORAL_TABLET | ORAL | Status: DC
  Administered 2016-03-05 – 2016-03-06 (×2): 2 via ORAL
  Filled 2016-03-05 (×2): qty 2

## 2016-03-05 MED ORDER — LACTATED RINGERS IV SOLN: INTRAVENOUS | Status: DC

## 2016-03-05 MED ORDER — OXYCODONE-ACETAMINOPHEN 5-325 MG PO TABS
1.0000 | ORAL_TABLET | ORAL | Status: DC | PRN
  Administered 2016-03-05 – 2016-03-07 (×8): 1 via ORAL
  Filled 2016-03-05 (×8): qty 1

## 2016-03-05 MED ORDER — IBUPROFEN 600 MG PO TABS
600.0000 mg | ORAL_TABLET | Freq: Four times a day (QID) | ORAL | Status: DC
  Administered 2016-03-05: 600 mg via ORAL
  Filled 2016-03-05: qty 1

## 2016-03-05 MED ORDER — DIPHENHYDRAMINE HCL 25 MG PO CAPS: 25.0000 mg | ORAL_CAPSULE | Freq: Four times a day (QID) | ORAL | Status: DC | PRN

## 2016-03-05 MED ORDER — COCONUT OIL OIL: 1.0000 "application " | TOPICAL_OIL | Status: DC | PRN

## 2016-03-05 MED ORDER — MENTHOL 3 MG MT LOZG
1.0000 | LOZENGE | OROMUCOSAL | Status: DC | PRN
  Filled 2016-03-05: qty 9

## 2016-03-05 MED ORDER — WITCH HAZEL-GLYCERIN EX PADS: 1.0000 "application " | MEDICATED_PAD | CUTANEOUS | Status: DC | PRN

## 2016-03-05 MED ORDER — SIMETHICONE 80 MG PO CHEW
80.0000 mg | CHEWABLE_TABLET | ORAL | Status: DC | PRN
  Filled 2016-03-05: qty 1

## 2016-03-05 MED ORDER — SIMETHICONE 80 MG PO CHEW
80.0000 mg | CHEWABLE_TABLET | Freq: Three times a day (TID) | ORAL | Status: DC
  Administered 2016-03-05 – 2016-03-07 (×5): 80 mg via ORAL
  Filled 2016-03-05 (×4): qty 1

## 2016-03-05 MED ORDER — SIMETHICONE 80 MG PO CHEW
80.0000 mg | CHEWABLE_TABLET | ORAL | Status: DC
  Administered 2016-03-05 – 2016-03-06 (×2): 80 mg via ORAL
  Filled 2016-03-05 (×2): qty 1

## 2016-03-05 MED ORDER — PRENATAL MULTIVITAMIN CH
1.0000 | ORAL_TABLET | Freq: Every day | ORAL | Status: DC
  Administered 2016-03-05 – 2016-03-06 (×2): 1 via ORAL
  Filled 2016-03-05 (×2): qty 1

## 2016-03-05 MED ORDER — OXYCODONE-ACETAMINOPHEN 5-325 MG PO TABS: 2.0000 | ORAL_TABLET | ORAL | Status: DC | PRN

## 2016-03-05 MED ORDER — LABETALOL HCL 100 MG PO TABS
100.0000 mg | ORAL_TABLET | Freq: Two times a day (BID) | ORAL | Status: DC
  Administered 2016-03-05 – 2016-03-07 (×5): 100 mg via ORAL
  Filled 2016-03-05 (×5): qty 1

## 2016-03-05 NOTE — Anesthesia Postprocedure Evaluation (Signed)
Anesthesia Post Note  Patient: Alice CrankerHeather J Harrell  Procedure(s) Performed: Procedure(s) (LRB): CESAREAN SECTION (N/A)  Patient location during evaluation: Mother Baby Anesthesia Type: Spinal Level of consciousness: awake, awake and alert and oriented Pain management: pain level controlled Vital Signs Assessment: post-procedure vital signs reviewed and stable Respiratory status: spontaneous breathing, nonlabored ventilation and respiratory function stable Cardiovascular status: stable Postop Assessment: no backache Anesthetic complications: no Comments: Site is clean, dry, intact.    Last Vitals:  Vitals:   03/05/16 0355 03/05/16 0731  BP: 122/73 135/81  Pulse: 72 71  Resp: 20 18  Temp: 36.9 C     Last Pain:  Vitals:   03/05/16 0425  TempSrc:   PainSc: 2                  Casey Burkitthuy Karlton Maya

## 2016-03-05 NOTE — Anesthesia Post-op Follow-up Note (Signed)
  Anesthesia Pain Follow-up Note  Patient: Alice Harrell  Day #: 1  Date of Follow-up: 03/05/2016 Time: 7:41 AM  Last Vitals:  Vitals:   03/05/16 0355 03/05/16 0731  BP: 122/73 135/81  Pulse: 72 71  Resp: 20 18  Temp: 36.9 C 36.7 C    Level of Consciousness: alert  Pain: none   Side Effects:None  Catheter Site Exam:clean, dry, healed     Plan: D/C from anesthesia care  [facility] Micheal Sheen

## 2016-03-05 NOTE — Progress Notes (Signed)
Subjective:  Doing well, feeling a little more sore this morning.  No HA or vision changes.  Minimal lochia.  Denies fevers, or chills   Objective:  Blood pressure 135/81, pulse 71, temperature 98 F (36.7 C), temperature source Oral, resp. rate 18, height 5' 4"  (1.626 m), weight 227 lb (103 kg), last menstrual period 06/03/2015, SpO2 99 %, unknown if currently breastfeeding.   Intake/Output Summary (Last 24 hours) at 03/05/16 0845 Last data filed at 03/05/16 0355  Gross per 24 hour  Intake          2510.42 ml  Output             2400 ml  Net           110.42 ml   General: NAD Pulmonary: no increased work of breathing Abdomen: non-distended, non-tender, fundus firm at level of umbilicus Incision: D/C/I Extremities: no edema, no erythema, no tenderness  Results for orders placed or performed during the hospital encounter of 03/04/16 (from the past 72 hour(s))  Type and screen [place]     Status: None   Collection Time: 03/04/16  4:07 PM  Result Value Ref Range   ABO/RH(D) A POS    Antibody Screen NEG    Sample Expiration 03/07/2016   Protein / creatinine ratio, urine     Status: Abnormal   Collection Time: 03/04/16  4:07 PM  Result Value Ref Range   Creatinine, Urine 85 mg/dL   Total Protein, Urine 28 mg/dL    Comment: NO NORMAL RANGE ESTABLISHED FOR THIS TEST   Protein Creatinine Ratio 0.33 (H) 0.00 - 0.15 mg/mg[Cre]  Sample to Blood Bank     Status: None   Collection Time: 03/04/16  4:07 PM  Result Value Ref Range   Blood Bank Specimen SAMPLE AVAILABLE FOR TESTING    Sample Expiration 03/07/2016   CBC     Status: Abnormal   Collection Time: 03/04/16  4:57 PM  Result Value Ref Range   WBC 10.9 3.6 - 11.0 K/uL   RBC 4.20 3.80 - 5.20 MIL/uL   Hemoglobin 12.0 12.0 - 16.0 g/dL   HCT 35.0 35.0 - 47.0 %   MCV 83.4 80.0 - 100.0 fL   MCH 28.7 26.0 - 34.0 pg   MCHC 34.3 32.0 - 36.0 g/dL   RDW 16.2 (H) 11.5 - 14.5 %   Platelets 117 (L) 150 - 440  K/uL  Comprehensive metabolic panel     Status: Abnormal   Collection Time: 03/04/16  4:57 PM  Result Value Ref Range   Sodium 136 135 - 145 mmol/L   Potassium 3.9 3.5 - 5.1 mmol/L   Chloride 109 101 - 111 mmol/L   CO2 18 (L) 22 - 32 mmol/L   Glucose, Bld 64 (L) 65 - 99 mg/dL   BUN 14 6 - 20 mg/dL   Creatinine, Ser 0.97 0.44 - 1.00 mg/dL   Calcium 9.2 8.9 - 10.3 mg/dL   Total Protein 6.8 6.5 - 8.1 g/dL   Albumin 3.0 (L) 3.5 - 5.0 g/dL   AST 15 15 - 41 U/L   ALT 10 (L) 14 - 54 U/L   Alkaline Phosphatase 106 38 - 126 U/L   Total Bilirubin 0.5 0.3 - 1.2 mg/dL   GFR calc non Af Amer >60 >60 mL/min   GFR calc Af Amer >60 >60 mL/min    Comment: (NOTE) The eGFR has been calculated using the CKD EPI equation. This calculation has not been  validated in all clinical situations. eGFR's persistently <60 mL/min signify possible Chronic Kidney Disease.    Anion gap 9 5 - 15  RPR     Status: None   Collection Time: 03/04/16  4:57 PM  Result Value Ref Range   RPR Ser Ql Non Reactive Non Reactive    Comment: (NOTE) Performed At: [facility] [place], [place] [id number] Lindon Romp MD [id number]   CBC     Status: Abnormal   Collection Time: 03/05/16  5:06 AM  Result Value Ref Range   WBC 12.1 (H) 3.6 - 11.0 K/uL   RBC 3.96 3.80 - 5.20 MIL/uL   Hemoglobin 11.1 (L) 12.0 - 16.0 g/dL   HCT 33.4 (L) 35.0 - 47.0 %   MCV 84.5 80.0 - 100.0 fL   MCH 28.0 26.0 - 34.0 pg   MCHC 33.2 32.0 - 36.0 g/dL   RDW 16.2 (H) 11.5 - 14.5 %   Platelets 115 (L) 150 - 440 K/uL     Assessment:   30 y.o. [id number] postoperativeday #1 RLTCS, superimposed preeclampsia by worsening BP, thrombocytopenia, and P/C ratio of 303   Plan:  1) Acute blood loss anemia - hemodynamically stable and asymptomatic - po ferrous sulfate  2) --/--/A POS (09/20 1607) / Rubella Immune / Varicella Immune  3) TDAP status UTD  4) Bottle/Ortho-tri-Cyclen  5) Superimposed Preeclampsia - continue  home dose labetalol  6) Disposition - anticipate discharge POD3-4

## 2016-03-06 MED ORDER — IBUPROFEN 600 MG PO TABS
600.0000 mg | ORAL_TABLET | Freq: Four times a day (QID) | ORAL | Status: DC
  Administered 2016-03-06 – 2016-03-07 (×6): 600 mg via ORAL
  Filled 2016-03-06 (×6): qty 1

## 2016-03-06 NOTE — Progress Notes (Signed)
Subjective:  Doing well, pain well controlled on PO meds, +ambulation, +tolerating PO, + voiding spontaneously.  No HA or vision changes.  Minimal lochia.  Denies fevers, or chills   Objective:  BP 128/72 (BP Location: Right Arm)   Pulse 78   Temp 99 F (37.2 C) (Oral)   Resp 20   Ht 5' 4"  (1.626 m)   Wt 227 lb (103 kg)   LMP 06/03/2015 Comment: planning c-section end of Sept 2017  SpO2 98%   Breastfeeding? Unknown   BMI 38.96 kg/m     Intake/Output Summary (Last 24 hours) at 03/06/16 1241 Last data filed at 03/05/16 2155  Gross per 24 hour  Intake                0 ml  Output             1000 ml  Net            -1000 ml   General: NAD Pulmonary: no increased work of breathing Abdomen: non-distended, non-tender, fundus firm at level of umbilicus Incision: D/C/I Extremities: no edema, no erythema, no tenderness  Results for orders placed or performed during the hospital encounter of 03/04/16 (from the past 72 hour(s))  Type and screen [place]     Status: None   Collection Time: 03/04/16  4:07 PM  Result Value Ref Range   ABO/RH(D) A POS    Antibody Screen NEG    Sample Expiration 03/07/2016   Protein / creatinine ratio, urine     Status: Abnormal   Collection Time: 03/04/16  4:07 PM  Result Value Ref Range   Creatinine, Urine 85 mg/dL   Total Protein, Urine 28 mg/dL    Comment: NO NORMAL RANGE ESTABLISHED FOR THIS TEST   Protein Creatinine Ratio 0.33 (H) 0.00 - 0.15 mg/mg[Cre]  Sample to Blood Bank     Status: None   Collection Time: 03/04/16  4:07 PM  Result Value Ref Range   Blood Bank Specimen SAMPLE AVAILABLE FOR TESTING    Sample Expiration 03/07/2016   CBC     Status: Abnormal   Collection Time: 03/04/16  4:57 PM  Result Value Ref Range   WBC 10.9 3.6 - 11.0 K/uL   RBC 4.20 3.80 - 5.20 MIL/uL   Hemoglobin 12.0 12.0 - 16.0 g/dL   HCT 35.0 35.0 - 47.0 %   MCV 83.4 80.0 - 100.0 fL   MCH 28.7 26.0 - 34.0 pg   MCHC 34.3 32.0 - 36.0  g/dL   RDW 16.2 (H) 11.5 - 14.5 %   Platelets 117 (L) 150 - 440 K/uL  Comprehensive metabolic panel     Status: Abnormal   Collection Time: 03/04/16  4:57 PM  Result Value Ref Range   Sodium 136 135 - 145 mmol/L   Potassium 3.9 3.5 - 5.1 mmol/L   Chloride 109 101 - 111 mmol/L   CO2 18 (L) 22 - 32 mmol/L   Glucose, Bld 64 (L) 65 - 99 mg/dL   BUN 14 6 - 20 mg/dL   Creatinine, Ser 0.97 0.44 - 1.00 mg/dL   Calcium 9.2 8.9 - 10.3 mg/dL   Total Protein 6.8 6.5 - 8.1 g/dL   Albumin 3.0 (L) 3.5 - 5.0 g/dL   AST 15 15 - 41 U/L   ALT 10 (L) 14 - 54 U/L   Alkaline Phosphatase 106 38 - 126 U/L   Total Bilirubin 0.5 0.3 - 1.2 mg/dL   GFR  calc non Af Amer >60 >60 mL/min   GFR calc Af Amer >60 >60 mL/min    Comment: (NOTE) The eGFR has been calculated using the CKD EPI equation. This calculation has not been validated in all clinical situations. eGFR's persistently <60 mL/min signify possible Chronic Kidney Disease.    Anion gap 9 5 - 15  RPR     Status: None   Collection Time: 03/04/16  4:57 PM  Result Value Ref Range   RPR Ser Ql Non Reactive Non Reactive    Comment: (NOTE) Performed At: [facility] [place], [place] [id number] Lindon Romp MD [id number]   CBC     Status: Abnormal   Collection Time: 03/05/16  5:06 AM  Result Value Ref Range   WBC 12.1 (H) 3.6 - 11.0 K/uL   RBC 3.96 3.80 - 5.20 MIL/uL   Hemoglobin 11.1 (L) 12.0 - 16.0 g/dL   HCT 33.4 (L) 35.0 - 47.0 %   MCV 84.5 80.0 - 100.0 fL   MCH 28.0 26.0 - 34.0 pg   MCHC 33.2 32.0 - 36.0 g/dL   RDW 16.2 (H) 11.5 - 14.5 %   Platelets 115 (L) 150 - 440 K/uL     Assessment:   30 y.o. [id number] postoperativeday #2 RLTCS, superimposed preeclampsia by worsening BP, thrombocytopenia, and P/C ratio of 303, doing well today   Plan:  1) Acute blood loss anemia - hemodynamically stable and asymptomatic - po ferrous sulfate  2) --/--/A POS (09/20 1607) / Rubella Immune / Varicella Immune  3) TDAP  status UTD  4) Formula feeding/Ortho-tri-Cyclen  5) Superimposed Preeclampsia - continue home dose labetalol  6) Disposition - anticipate discharge POD3-4  Prentice Docker, MD 03/06/2016 12:43 PM

## 2016-03-06 NOTE — Discharge Summary (Signed)
Obstetric Discharge Summary  Reason for Admission: Superimposed preeclampsia, Gestational diabetes, history of prior Cesaran section Prenatal Procedures: none Intrapartum Procedures: cesarean: low cervical, transverse Postpartum Procedures: none Complications-Operative and Postpartum: none   Hemoglobin  Date Value Ref Range Status  03/05/2016 11.1 (L) 12.0 - 16.0 g/dL Final   HCT  Date Value Ref Range Status  03/05/2016 33.4 (L) 35.0 - 47.0 % Final    Physical Exam:  General: alert, appears stated age and no distress Lochia: appropriate Uterine Fundus: firm Incision: healing well, On Q Pump intact DVT Evaluation: No evidence of DVT seen on physical exam.  Discharge Diagnoses: Term Pregnancy-delivered  Discharge Information: Date: 03/07/2016 Activity: pelvic rest Diet: routine Medications: PNV, Percocet and Labetalol, Ortho Tri Cyclen Condition: stable Discharge to: home  Bottle feeding/Contraception: Ortho Tri Cyclen A+, Rubella Immune, Varicella Immune, TDAP UTD  Follow-up Information    STAEBLER, Florina OuANDREAS M, MD Follow up in 1 week(s).   Specialty:  Obstetrics and Gynecology Why:  BP and incision check Contact information: [address] [place] [id number] [phone]           Newborn Data: Live born female born 03/04/2017 Birth Weight: 6 lb 7.4 oz (2930 g) APGAR: 9, 9  Home with mother.  STAEBLER, ANDREAS M 03/06/2016, 5:50 AM   Updated prior to discharge by: Tresea MallJane Dymin Dingledine, CNM 03/07/2016

## 2016-03-07 MED ORDER — OXYCODONE-ACETAMINOPHEN 5-325 MG PO TABS: 1.0000 | ORAL_TABLET | ORAL | 0 refills | Status: DC | PRN

## 2016-03-07 MED ORDER — PRENATAL MULTIVITAMIN CH: 1.0000 | ORAL_TABLET | Freq: Every day | ORAL | Status: DC

## 2016-03-07 MED ORDER — LABETALOL HCL 100 MG PO TABS: 100.0000 mg | ORAL_TABLET | Freq: Two times a day (BID) | ORAL | 1 refills | Status: DC

## 2016-03-07 MED ORDER — NORGESTIM-ETH ESTRAD TRIPHASIC 0.18/0.215/0.25 MG-35 MCG PO TABS: 1.0000 | ORAL_TABLET | Freq: Every day | ORAL | 11 refills | Status: DC

## 2016-03-07 NOTE — Progress Notes (Signed)
Pt discharged home with infant.  Discharge instructions and follow up appointment given to and reviewed with pt.  Pt verbalized understanding.  Escorted by auxillary. 

## 2016-03-07 NOTE — Discharge Instructions (Signed)
Postpartum Hypertension  Postpartum hypertension is high blood pressure after pregnancy that remains higher than normal for more than two days after delivery. You may not realize that you have postpartum hypertension if your blood pressure is not being checked regularly. In some cases, postpartum hypertension will go away on its own, usually within a week of delivery. However, for some women, medical treatment is required to prevent serious complications, such as seizures or stroke.  The following things can affect your blood pressure:  · The type of delivery you had.  · Having received IV fluids or other medicines during or after delivery.  CAUSES   Postpartum hypertension may be caused by any of the following or by a combination of any of the following:  · Hypertension that existed before pregnancy (chronic hypertension).  · Gestational hypertension.  · Preeclampsia or eclampsia.  · Receiving a lot of fluid through an IV during or after delivery.  · Medicines.  · HELLP syndrome.  · Hyperthyroidism.  · Stroke.  · Other rare neurological or blood disorders.  In some cases, the cause may not be known.  RISK FACTORS  Postpartum hypertension can be related to one or more risk factors, such as:  · Chronic hypertension. In some cases, this may not have been diagnosed before pregnancy.  · Obesity.  · Type 2 diabetes.  · Kidney disease.  · Family history of preeclampsia.  · Other medical conditions that cause hormonal imbalances.  SIGNS AND SYMPTOMS  As with all types of hypertension, postpartum hypertension may not have any symptoms. Depending on how high your blood pressure is, you may experience:  · Headaches. These may be mild, moderate, or severe. They may also be steady, constant, or sudden in onset (thunderclap headache).  · Visual changes.  · Dizziness.  · Shortness of breath.  · Swelling of your hands, feet, lower legs, or face. In some cases, you may have swelling in more than one of these locations.  · Heart  palpitations or a racing heartbeat.  · Difficulty breathing while lying down.  · Decreased urination.  Other rare signs and symptoms may include:  · Sweating more than usual. This lasts longer than a few days after delivery.  · Chest pain.  · Sudden dizziness when you get up from sitting or lying down.  · Seizures.  · Nausea or vomiting.  · Abdominal pain.  DIAGNOSIS  The diagnosis of postpartum hypertension is made through a combination of physical examination findings and testing of your blood and urine. You may also have additional tests, such as a CT scan or an MRI, to check for other complications of postpartum hypertension.  TREATMENT  When blood pressure is high enough to require treatment, your options may include:  · Medicines to reduce blood pressure (antihypertensives). Tell your health care provider if you are breastfeeding or if you plan to breastfeed. There are many antihypertensive medicines that are safe to take while breastfeeding.  · Stopping medicines that may be causing hypertension.  · Treating medical conditions that are causing hypertension.  · Treating the complications of hypertension, such as seizures, stroke, or kidney problems.  Your health care provider will also continue to monitor your blood pressure closely and repeatedly until it is within a safe range for you.   HOME CARE INSTRUCTIONS  · Take medicines only as directed by your health care provider.  · Get regular exercise after your health care provider tells you that it is safe.  · Follow   your health care provider's recommendations on fluid and salt restrictions.  · Do not use any tobacco products, including cigarettes, chewing tobacco, or electronic cigarettes. If you need help quitting, ask your health care provider.  · Keep all follow-up visits as directed by your health care provider. This is important.  SEEK MEDICAL CARE IF:  · Your symptoms get worse.  · You have new symptoms, such as:    Headache.    Dizziness.    Visual  changes.  SEEK IMMEDIATE MEDICAL CARE IF:  · You develop a severe or sudden headache.  · You have seizures.  · You develop numbness or weakness on one side of your body.  · You have difficulty thinking, speaking, or swallowing.  · You develop severe abdominal pain.  · You develop difficulty breathing, chest pain, a racing heartbeat, or heart palpitations.  These symptoms may represent a serious problem that is an emergency. Do not wait to see if the symptoms will go away. Get medical help right away. Call your local emergency services (911 in the [place] Do not drive yourself to the hospital.     This information is not intended to replace advice given to you by your health care provider. Make sure you discuss any questions you have with your health care provider.     Document Released: 02/02/2014 Document Reviewed: 02/02/2014  Elsevier Interactive Patient Education ©2016 [employer]

## 2016-03-11 ENCOUNTER — Other Ambulatory Visit: Payer: BC Managed Care – PPO

## 2016-03-12 ENCOUNTER — Inpatient Hospital Stay
Admission: RE | Admit: 2016-03-12 | Payer: BC Managed Care – PPO | Source: Ambulatory Visit | Admitting: Obstetrics and Gynecology

## 2016-03-12 ENCOUNTER — Encounter: Admission: RE | Payer: Self-pay | Source: Ambulatory Visit

## 2016-03-12 SURGERY — Surgical Case
Anesthesia: Choice

## 2017-04-15 ENCOUNTER — Telehealth: Payer: Self-pay | Admitting: Advanced Practice Midwife

## 2017-04-15 NOTE — Telephone Encounter (Signed)
Called and lvm for patient to call back to be schedule °

## 2017-04-15 NOTE — Telephone Encounter (Signed)
Patient needs refill on Norgestimate-Ethinyl Estradiol Triphasic (ORTHO TRI-CYCLEN, 28,) 0.18/0.215/0.25 MG-35 MCG and labetalol (NORMODYNE) 100 MG [facility] [address] pharmacy.

## 2017-04-16 ENCOUNTER — Other Ambulatory Visit: Payer: Self-pay | Admitting: Obstetrics and Gynecology

## 2017-04-16 MED ORDER — LABETALOL HCL 100 MG PO TABS: 100.0000 mg | ORAL_TABLET | Freq: Two times a day (BID) | ORAL | 4 refills | Status: DC

## 2017-04-16 MED ORDER — NORGESTIM-ETH ESTRAD TRIPHASIC 0.18/0.215/0.25 MG-35 MCG PO TABS: 1.0000 | ORAL_TABLET | Freq: Every day | ORAL | 4 refills | Status: DC

## 2017-04-16 NOTE — Telephone Encounter (Signed)
Both have been refilled.  

## 2017-04-16 NOTE — Telephone Encounter (Signed)
Pt is schedule 11/141/18 with Dr. Geanie CooleyStabeler

## 2017-04-16 NOTE — Telephone Encounter (Signed)
Please advise 

## 2017-04-28 ENCOUNTER — Ambulatory Visit (INDEPENDENT_AMBULATORY_CARE_PROVIDER_SITE_OTHER): Payer: BC Managed Care – PPO | Admitting: Obstetrics and Gynecology

## 2017-04-28 ENCOUNTER — Encounter: Payer: Self-pay | Admitting: Obstetrics and Gynecology

## 2017-04-28 VITALS — BP 128/86 | HR 87 | Ht 64.0 in | Wt 204.0 lb

## 2017-04-28 DIAGNOSIS — Z1239 Encounter for other screening for malignant neoplasm of breast: Secondary | ICD-10-CM

## 2017-04-28 DIAGNOSIS — Z1231 Encounter for screening mammogram for malignant neoplasm of breast: Secondary | ICD-10-CM

## 2017-04-28 DIAGNOSIS — Z01419 Encounter for gynecological examination (general) (routine) without abnormal findings: Secondary | ICD-10-CM

## 2017-04-28 MED ORDER — METOPROLOL SUCCINATE ER 25 MG PO TB24: 25.0000 mg | ORAL_TABLET | Freq: Every day | ORAL | 3 refills | Status: DC

## 2017-04-28 MED ORDER — NORETHIN ACE-ETH ESTRAD-FE 1-20 MG-MCG(24) PO CAPS: 1.0000 | ORAL_CAPSULE | Freq: Every day | ORAL | 3 refills | Status: DC

## 2017-04-28 NOTE — Patient Instructions (Signed)
Preventive Care 18-39 Years, Female Preventive care refers to lifestyle choices and visits with your health care provider that can promote health and wellness. What does preventive care include?  A yearly physical exam. This is also called an annual well check.  Dental exams once or twice a year.  Routine eye exams. Ask your health care provider how often you should have your eyes checked.  Personal lifestyle choices, including: ? Daily care of your teeth and gums. ? Regular physical activity. ? Eating a healthy diet. ? Avoiding tobacco and drug use. ? Limiting alcohol use. ? Practicing safe sex. ? Taking vitamin and mineral supplements as recommended by your health care provider. What happens during an annual well check? The services and screenings done by your health care provider during your annual well check will depend on your age, overall health, lifestyle risk factors, and family history of disease. Counseling Your health care provider may ask you questions about your:  Alcohol use.  Tobacco use.  Drug use.  Emotional well-being.  Home and relationship well-being.  Sexual activity.  Eating habits.  Work and work Statistician.  Method of birth control.  Menstrual cycle.  Pregnancy history.  Screening You may have the following tests or measurements:  Height, weight, and BMI.  Diabetes screening. This is done by checking your blood sugar (glucose) after you have not eaten for a while (fasting).  Blood pressure.  Lipid and cholesterol levels. These may be checked every 5 years starting at age 66.  Skin check.  Hepatitis C blood test.  Hepatitis B blood test.  Sexually transmitted disease (STD) testing.  BRCA-related cancer screening. This may be done if you have a family history of breast, ovarian, tubal, or peritoneal cancers.  Pelvic exam and Pap test. This may be done every 3 years starting at age 40. Starting at age 59, this may be done every 5  years if you have a Pap test in combination with an HPV test.  Discuss your test results, treatment options, and if necessary, the need for more tests with your health care provider. Vaccines Your health care provider may recommend certain vaccines, such as:  Influenza vaccine. This is recommended every year.  Tetanus, diphtheria, and acellular pertussis (Tdap, Td) vaccine. You may need a Td booster every 10 years.  Varicella vaccine. You may need this if you have not been vaccinated.  HPV vaccine. If you are 69 or younger, you may need three doses over 6 months.  Measles, mumps, and rubella (MMR) vaccine. You may need at least one dose of MMR. You may also need a second dose.  Pneumococcal 13-valent conjugate (PCV13) vaccine. You may need this if you have certain conditions and were not previously vaccinated.  Pneumococcal polysaccharide (PPSV23) vaccine. You may need one or two doses if you smoke cigarettes or if you have certain conditions.  Meningococcal vaccine. One dose is recommended if you are age 27-21 years and a first-year college student living in a residence hall, or if you have one of several medical conditions. You may also need additional booster doses.  Hepatitis A vaccine. You may need this if you have certain conditions or if you travel or work in places where you may be exposed to hepatitis A.  Hepatitis B vaccine. You may need this if you have certain conditions or if you travel or work in places where you may be exposed to hepatitis B.  Haemophilus influenzae type b (Hib) vaccine. You may need this if  you have certain risk factors.  Talk to your health care provider about which screenings and vaccines you need and how often you need them. This information is not intended to replace advice given to you by your health care provider. Make sure you discuss any questions you have with your health care provider. Document Released: 07/28/2001 Document Revised: 02/19/2016  Document Reviewed: 04/02/2015 Elsevier Interactive Patient Education  2017 [employer].

## 2017-04-28 NOTE — Progress Notes (Signed)
Gynecology Annual Exam  PCP: Patient, No Pcp Per  Chief Complaint:  Chief Complaint  Patient presents with  . Gynecologic Exam    No cycle since starting new ocp    History of Present Illness: Patient is a 31 y.o. [id number] presents for annual exam. The patient has no complaints today.  LMP: No LMP recorded. Average N/A Duration of flow: N/A Heavy Menses: no Clots: no Intermenstrual Bleeding: no Postcoital Bleeding: no Dysmenorrhea: no  The patient is sexually active. She currently uses OCP (estrogen/progesterone) for contraception. She denies dyspareunia.  The patient does perform self breast exams.  There is no notable family history of breast or ovarian cancer in her family.  The patient wears seatbelts: yes.   The patient has regular exercise: not asked.    The patient denies current symptoms of depression.    Review of Systems: Review of Systems  Constitutional: Negative for chills and fever.  HENT: Negative for congestion.   Respiratory: Negative for cough and shortness of breath.   Cardiovascular: Negative for chest pain and palpitations.  Gastrointestinal: Negative for abdominal pain, constipation, diarrhea, heartburn, nausea and vomiting.  Genitourinary: Negative for dysuria, frequency and urgency.  Skin: Negative for itching and rash.  Neurological: Negative for dizziness and headaches.  Endo/Heme/Allergies: Negative for polydipsia.  Psychiatric/Behavioral: Negative for depression.    Past Medical History:  Past Medical History:  Diagnosis Date  . Gestational diabetes   . Hypertension   . Polycystic kidney disease     Past Surgical History:  Past Surgical History:  Procedure Laterality Date  . CESAREAN SECTION N/A 03/04/2016   Procedure: CESAREAN SECTION;  Surgeon: Nadara Mustardobert P Harris, MD;  Location: [facility] ORS;  Service: Obstetrics;  Laterality: N/A;    Gynecologic History:  No LMP recorded. Contraception: OCP (estrogen/progesterone) Last Pap:  Results were:07/10/2015 NIL and HR HPV negative   Obstetric History: [id number]  Family History:  No family history on file.  Social History:  Social History   Socioeconomic History  . Marital status: Married    Spouse name: Not on file  . Number of children: Not on file  . Years of education: Not on file  . Highest education level: Not on file  Social Needs  . Financial resource strain: Not on file  . Food insecurity - worry: Not on file  . Food insecurity - inability: Not on file  . Transportation needs - medical: Not on file  . Transportation needs - non-medical: Not on file  Occupational History  . Not on file  Tobacco Use  . Smoking status: Never Smoker  . Smokeless tobacco: Never Used  Substance and Sexual Activity  . Alcohol use: No  . Drug use: No  . Sexual activity: Yes    Birth control/protection: Pill  Other Topics Concern  . Not on file  Social History Narrative  . Not on file    Allergies:  Allergies  Allergen Reactions  . Morphine And Related Itching    Medications: Prior to Admission medications   Medication Sig Start Date End Date Taking? Authorizing Provider  LO LOESTRIN FE 1 MG-10 MCG / 10 MCG tablet  04/03/17   [provider]  metoprolol succinate (TOPROL-XL) 25 MG 24 hr tablet  04/03/17   [provider]    Physical Exam Vitals: Blood pressure 128/86, pulse 87, height 5\' 4"  (1.626 m), weight 204 lb (92.5 kg), not currently breastfeeding.  General: NAD HEENT: normocephalic, anicteric Thyroid: no enlargement, no palpable nodules  Pulmonary: No increased work of breathing, CTAB Cardiovascular: RRR, distal pulses 2+ Breast: Breast symmetrical, no tenderness, no palpable nodules or masses, no skin or nipple retraction present, no nipple discharge.  No axillary or supraclavicular lymphadenopathy. Abdomen: NABS, soft, non-tender, non-distended.  Umbilicus without lesions.  No hepatomegaly, splenomegaly or masses palpable. No  evidence of hernia  Genitourinary:  External: Normal external female genitalia.  Normal urethral meatus, normal  Bartholin's and Skene's glands.    Vagina: Normal vaginal mucosa, no evidence of prolapse.    Cervix: Grossly normal in appearance, no bleeding  Uterus: Non-enlarged, mobile, normal contour.  No CMT  Adnexa: ovaries non-enlarged, no adnexal masses  Rectal: deferred  Lymphatic: no evidence of inguinal lymphadenopathy Extremities: no edema, erythema, or tenderness Neurologic: Grossly intact Psychiatric: mood appropriate, affect full  Female chaperone present for pelvic and breast  portions of the physical exam    Assessment: 31 y.o. [id number] routine annual exam  Plan: Problem List Items Addressed This Visit    None    Visit Diagnoses    Breast screening    -  Primary   Encounter for gynecological examination without abnormal finding          1) STI screening was not offered  2) ASCCP guidelines and rational discussed.  Patient opts for every 3 years screening interval  3) Contraception - switch to taytulla, to achieve predictable withdrawal bleed.  If not discussed further laboratory work up  4) Routine healthcare maintenance including cholesterol, diabetes screening discussed managed by PCP  5) Follow up 1 year for routine annual exam

## 2018-04-01 ENCOUNTER — Telehealth: Payer: Self-pay | Admitting: Obstetrics and Gynecology

## 2018-04-01 NOTE — Telephone Encounter (Signed)
Please advise when you get back to office

## 2018-04-01 NOTE — Telephone Encounter (Signed)
Patient is schedule for annual on 05/03/18 with AMS. Patient is requesting refills on  (metroprolol succinate 25mg  )-90 count  (Taytulla)- 84 count

## 2018-04-04 ENCOUNTER — Other Ambulatory Visit: Payer: Self-pay | Admitting: Obstetrics and Gynecology

## 2018-04-04 MED ORDER — METOPROLOL SUCCINATE ER 25 MG PO TB24: 25.0000 mg | ORAL_TABLET | Freq: Every day | ORAL | 0 refills | Status: DC

## 2018-04-04 NOTE — Telephone Encounter (Signed)
sent 

## 2018-05-03 ENCOUNTER — Encounter: Payer: Self-pay | Admitting: Obstetrics and Gynecology

## 2018-05-03 ENCOUNTER — Ambulatory Visit (INDEPENDENT_AMBULATORY_CARE_PROVIDER_SITE_OTHER): Payer: BC Managed Care – PPO | Admitting: Obstetrics and Gynecology

## 2018-05-03 VITALS — BP 128/90 | HR 98 | Ht 65.0 in | Wt 205.0 lb

## 2018-05-03 DIAGNOSIS — Z1239 Encounter for other screening for malignant neoplasm of breast: Secondary | ICD-10-CM

## 2018-05-03 DIAGNOSIS — Z3041 Encounter for surveillance of contraceptive pills: Secondary | ICD-10-CM

## 2018-05-03 DIAGNOSIS — Z01419 Encounter for gynecological examination (general) (routine) without abnormal findings: Secondary | ICD-10-CM

## 2018-05-03 DIAGNOSIS — I10 Essential (primary) hypertension: Secondary | ICD-10-CM

## 2018-05-03 MED ORDER — METOPROLOL SUCCINATE ER 25 MG PO TB24: 25.0000 mg | ORAL_TABLET | Freq: Every day | ORAL | 3 refills | Status: DC

## 2018-05-03 MED ORDER — NORETHIN ACE-ETH ESTRAD-FE 1-20 MG-MCG(24) PO CAPS: 1.0000 | ORAL_CAPSULE | Freq: Every day | ORAL | 3 refills | Status: DC

## 2018-05-03 NOTE — Progress Notes (Signed)
Gynecology Annual Exam   PCP: Patient, No Pcp Per  Chief Complaint:  Chief Complaint  Patient presents with  . Gynecologic Exam    Refill medications    History of Present Illness: Patient is a 32 y.o. Z6X0960 presents for annual exam. The patient has no complaints today.   LMP: Patient's last menstrual period was 04/07/2018 (exact date). Average Interval: regular, 28 days Duration of flow: 5 days Heavy Menses: no Clots: no Intermenstrual Bleeding: no Postcoital Bleeding: no Dysmenorrhea: no  The patient is sexually active. She currently uses OCP (estrogen/progesterone) for contraception. She denies dyspareunia.  The patient does perform self breast exams.  There is no notable family history of breast or ovarian cancer in her family.  The patient wears seatbelts: yes.   The patient has regular exercise: not asked.    The patient denies current symptoms of depression.    Review of Systems: Review of Systems  Constitutional: Negative for chills and fever.  HENT: Negative for congestion.   Respiratory: Negative for cough and shortness of breath.   Cardiovascular: Negative for chest pain and palpitations.  Gastrointestinal: Negative for abdominal pain, constipation, diarrhea, heartburn, nausea and vomiting.  Genitourinary: Negative for dysuria, frequency and urgency.  Skin: Negative for itching and rash.  Neurological: Negative for dizziness and headaches.  Endo/Heme/Allergies: Negative for polydipsia.  Psychiatric/Behavioral: Negative for depression.    Past Medical History:  Past Medical History:  Diagnosis Date  . Gestational diabetes   . Hypertension   . Polycystic kidney disease     Past Surgical History:  Past Surgical History:  Procedure Laterality Date  . CESAREAN SECTION N/A 03/04/2016   Procedure: CESAREAN SECTION;  Surgeon: Nadara Mustard, MD;  Location: [facility] ORS;  Service: Obstetrics;  Laterality: N/A;    Gynecologic History:  Patient's last  menstrual period was 04/07/2018 (exact date). Contraception: OCP (estrogen/progesterone) Last Pap: Results were:07/10/2015 NIL and HR HPV negative   Obstetric History: [id number]  Family History:  History reviewed. No pertinent family history.  Social History:  Social History   Socioeconomic History  . Marital status: Married    Spouse name: Not on file  . Number of children: Not on file  . Years of education: Not on file  . Highest education level: Not on file  Occupational History  . Not on file  Social Needs  . Financial resource strain: Not on file  . Food insecurity:    Worry: Not on file    Inability: Not on file  . Transportation needs:    Medical: Not on file    Non-medical: Not on file  Tobacco Use  . Smoking status: Never Smoker  . Smokeless tobacco: Never Used  Substance and Sexual Activity  . Alcohol use: No  . Drug use: No  . Sexual activity: Yes    Birth control/protection: Pill  Lifestyle  . Physical activity:    Days per week: Not on file    Minutes per session: Not on file  . Stress: Not on file  Relationships  . Social connections:    Talks on phone: Not on file    Gets together: Not on file    Attends religious service: Not on file    Active member of club or organization: Not on file    Attends meetings of clubs or organizations: Not on file    Relationship status: Not on file  . Intimate partner violence:    Fear of current or ex partner: Not  on file    Emotionally abused: Not on file    Physically abused: Not on file    Forced sexual activity: Not on file  Other Topics Concern  . Not on file  Social History Narrative  . Not on file    Allergies:  Allergies  Allergen Reactions  . Morphine And Related Itching    Medications: Prior to Admission medications   Medication Sig Start Date End Date Taking? Authorizing Provider  metoprolol succinate (TOPROL-XL) 25 MG 24 hr tablet Take 1 tablet (25 mg total) by mouth daily. 04/04/18  Yes  Vena AustriaStaebler, Erva Koke, MD  Norethin Ace-Eth Estrad-FE (TAYTULLA) 1-20 MG-MCG(24) CAPS Take 1 tablet daily by mouth. 04/28/17  Yes Vena AustriaStaebler, Jonah Nestle, MD    Physical Exam Vitals: Blood pressure 128/90, pulse 98, height 5\' 5"  (1.651 m), weight 205 lb (93 kg), last menstrual period 04/07/2018.  General: NAD HEENT: normocephalic, anicteric Thyroid: no enlargement, no palpable nodules Pulmonary: No increased work of breathing, CTAB Cardiovascular: RRR, distal pulses 2+ Breast: Breast symmetrical, no tenderness, no palpable nodules or masses, no skin or nipple retraction present, no nipple discharge.  No axillary or supraclavicular lymphadenopathy. Abdomen: NABS, soft, non-tender, non-distended.  Umbilicus without lesions.  No hepatomegaly, splenomegaly or masses palpable. No evidence of hernia  Genitourinary:  External: Normal external female genitalia.  Normal urethral meatus, normal Bartholin's and Skene's glands.    Vagina: Normal vaginal mucosa, no evidence of prolapse.    Cervix: Grossly normal in appearance, no bleeding  Uterus: Non-enlarged, mobile, normal contour.  No CMT  Adnexa: ovaries non-enlarged, no adnexal masses  Rectal: deferred  Lymphatic: no evidence of inguinal lymphadenopathy Extremities: no edema, erythema, or tenderness Neurologic: Grossly intact Psychiatric: mood appropriate, affect full  Female chaperone present for pelvic and breast  portions of the physical exam    Assessment: 32 y.o. [id number] routine annual exam  Plan: Problem List Items Addressed This Visit    None    Visit Diagnoses    Encounter for gynecological examination without abnormal finding    -  Primary   Breast screening       Encounter for surveillance of contraceptive pills       Essential hypertension       Relevant Medications   metoprolol succinate (TOPROL-XL) 25 MG 24 hr tablet      2) STI screening  was notoffered and therefore not obtained  2)  ASCCP guidelines and rational  discussed.  Patient opts for every 3 years screening interval  3) Contraception - the patient is currently using  OCP (estrogen/progesterone).  She is happy with her current form of contraception and plans to continue.   - She has been monitoring BP at home and does report intermittent high reading into 160/100 range.  We discussed progestin only option for contraception as well as WHO/CDC recommendations for contraceptive options in setting of HTN.  We also discussed surgical options.     4) Routine healthcare maintenance including cholesterol, diabetes screening discussed Declines  5) HTN - refill metoprolol  6) Return in about 1 year (around 05/04/2019) for annual.   Vena AustriaAndreas Shan Valdes, MD, Merlinda FrederickFACOG [facility], [facility] 05/03/2018, 3:23 PM

## 2018-08-29 ENCOUNTER — Telehealth: Payer: Self-pay

## 2018-08-29 NOTE — Telephone Encounter (Signed)
Pt's refill of bcp is $700.  Any advice?  What to do?  [phone]

## 2018-08-30 ENCOUNTER — Other Ambulatory Visit: Payer: Self-pay | Admitting: Obstetrics and Gynecology

## 2018-08-30 MED ORDER — DROSPIRENONE-ETHINYL ESTRADIOL 3-0.02 MG PO TABS: 1.0000 | ORAL_TABLET | Freq: Every day | ORAL | 11 refills | Status: DC

## 2018-08-30 NOTE — Telephone Encounter (Signed)
Pt aware of AMS calling in generic, per pharmacy insurance won't pay for name brand, Also, aware of discount card is available if she wants to come by and get it

## 2018-10-12 ENCOUNTER — Other Ambulatory Visit: Payer: Self-pay | Admitting: Obstetrics and Gynecology

## 2018-10-12 ENCOUNTER — Telehealth: Payer: Self-pay

## 2018-10-12 MED ORDER — DROSPIRENONE 4 MG PO TABS: 1.0000 | ORAL_TABLET | Freq: Every day | ORAL | 11 refills | Status: DC

## 2018-10-12 NOTE — Telephone Encounter (Signed)
Pt states she discussed w/AMS the last time she was in about some side effects of her OCP. One being it could make her BP high. She mentioned last time that it has made her BP a little less manageable. It still is less manageable. She's is inquiring if she could change OCP or if she should change her BP med. BV#[phone]

## 2019-05-10 ENCOUNTER — Ambulatory Visit: Payer: BC Managed Care – PPO | Admitting: Obstetrics and Gynecology

## 2019-06-12 ENCOUNTER — Ambulatory Visit (INDEPENDENT_AMBULATORY_CARE_PROVIDER_SITE_OTHER): Payer: BC Managed Care – PPO | Admitting: Obstetrics and Gynecology

## 2019-06-12 ENCOUNTER — Other Ambulatory Visit (HOSPITAL_COMMUNITY)
Admission: RE | Admit: 2019-06-12 | Discharge: 2019-06-12 | Disposition: A | Discharge status: Home / Self Care | Payer: BC Managed Care – PPO | Source: Ambulatory Visit | Attending: Obstetrics and Gynecology | Admitting: Obstetrics and Gynecology

## 2019-06-12 ENCOUNTER — Encounter: Payer: Self-pay | Admitting: Obstetrics and Gynecology

## 2019-06-12 ENCOUNTER — Other Ambulatory Visit: Payer: Self-pay

## 2019-06-12 VITALS — BP 124/80 | HR 97 | Ht 64.0 in | Wt 206.0 lb

## 2019-06-12 DIAGNOSIS — Z01419 Encounter for gynecological examination (general) (routine) without abnormal findings: Secondary | ICD-10-CM

## 2019-06-12 DIAGNOSIS — Z124 Encounter for screening for malignant neoplasm of cervix: Secondary | ICD-10-CM | POA: Diagnosis not present

## 2019-06-12 DIAGNOSIS — Z3041 Encounter for surveillance of contraceptive pills: Secondary | ICD-10-CM

## 2019-06-12 DIAGNOSIS — Z1239 Encounter for other screening for malignant neoplasm of breast: Secondary | ICD-10-CM

## 2019-06-12 MED ORDER — SLYND 4 MG PO TABS: 1.0000 | ORAL_TABLET | Freq: Every day | ORAL | 11 refills | Status: DC

## 2019-06-12 MED ORDER — METOPROLOL SUCCINATE ER 25 MG PO TB24: 25.0000 mg | ORAL_TABLET | Freq: Every day | ORAL | 3 refills | Status: DC

## 2019-06-12 NOTE — Progress Notes (Signed)
Gynecology Annual Exam   PCP: Patient, No Pcp Per  Chief Complaint:  Chief Complaint  Patient presents with  . Gynecologic Exam    History of Present Illness: Patient is a 33 y.o. [id number] presents for annual exam. The patient has no complaints today.   LMP: No LMP recorded. (Menstrual status: Oral contraceptives). Amenorrhea on slynd  The patient is sexually active. She currently uses oral progesterone-only contraceptive for contraception. She denies dyspareunia.  The patient does perform self breast exams.  There is no notable family history of breast or ovarian cancer in her family.  The patient wears seatbelts: yes.   The patient has regular exercise: not asked.    The patient denies current symptoms of depression.    Review of Systems: Review of Systems  Constitutional: Negative for chills and fever.  HENT: Negative for congestion.   Respiratory: Negative for cough and shortness of breath.   Cardiovascular: Negative for chest pain and palpitations.  Gastrointestinal: Negative for abdominal pain, constipation, diarrhea, heartburn, nausea and vomiting.  Genitourinary: Negative for dysuria, frequency and urgency.  Skin: Negative for itching and rash.  Neurological: Negative for dizziness and headaches.  Endo/Heme/Allergies: Negative for polydipsia.  Psychiatric/Behavioral: Negative for depression.    Past Medical History:  Past Medical History:  Diagnosis Date  . Gestational diabetes   . Hypertension   . Polycystic kidney disease     Past Surgical History:  Past Surgical History:  Procedure Laterality Date  . CESAREAN SECTION N/A 03/04/2016   Procedure: CESAREAN SECTION;  Surgeon: Nadara Mustard, MD;  Location: [facility] ORS;  Service: Obstetrics;  Laterality: N/A;    Gynecologic History:  No LMP recorded. (Menstrual status: Oral contraceptives). Contraception: oral progesterone-only contraceptive Last Pap: Results were:07/10/2015 NIL and HR HPV negative    Obstetric History: [id number]  Family History:  History reviewed. No pertinent family history.  Social History:  Social History   Socioeconomic History  . Marital status: Married    Spouse name: Not on file  . Number of children: Not on file  . Years of education: Not on file  . Highest education level: Not on file  Occupational History  . Not on file  Tobacco Use  . Smoking status: Never Smoker  . Smokeless tobacco: Never Used  Substance and Sexual Activity  . Alcohol use: No  . Drug use: No  . Sexual activity: Yes    Birth control/protection: Pill  Other Topics Concern  . Not on file  Social History Narrative  . Not on file   Social Determinants of Health   Financial Resource Strain:   . Difficulty of Paying Living Expenses: Not on file  Food Insecurity:   . Worried About Programme researcher, broadcasting/film/video in the Last Year: Not on file  . Ran Out of Food in the Last Year: Not on file  Transportation Needs:   . Lack of Transportation (Medical): Not on file  . Lack of Transportation (Non-Medical): Not on file  Physical Activity:   . Days of Exercise per Week: Not on file  . Minutes of Exercise per Session: Not on file  Stress:   . Feeling of Stress : Not on file  Social Connections:   . Frequency of Communication with Friends and Family: Not on file  . Frequency of Social Gatherings with Friends and Family: Not on file  . Attends Religious Services: Not on file  . Active Member of Clubs or Organizations: Not on file  . Attends  Club or Organization Meetings: Not on file  . Marital Status: Not on file  Intimate Partner Violence:   . Fear of Current or Ex-Partner: Not on file  . Emotionally Abused: Not on file  . Physically Abused: Not on file  . Sexually Abused: Not on file    Allergies:  Allergies  Allergen Reactions  . Morphine And Related Itching    Medications: Prior to Admission medications   Medication Sig Start Date End Date Taking? Authorizing Provider   Drospirenone (SLYND) 4 MG TABS Take 1 tablet by mouth daily. 10/12/18  Yes Malachy Mood, MD  metoprolol succinate (TOPROL-XL) 25 MG 24 hr tablet Take 1 tablet (25 mg total) by mouth daily. 05/03/18  Yes Malachy Mood, MD    Physical Exam Vitals: Blood pressure 124/80, pulse 97, height 5\' 4"  (1.626 m), weight 206 lb (93.4 kg).  General: NAD, well nourished, appears stated age 33: normocephalic, anicteric Pulmonary: No increased work of breathing, CTAB Cardiovascular: RRR, distal pulses 2+ Breast: Breast symmetrical, no tenderness, no palpable nodules or masses, no skin or nipple retraction present, no nipple discharge.  No axillary or supraclavicular lymphadenopathy. Abdomen: NABS, soft, non-tender, non-distended.  Umbilicus without lesions.  No hepatomegaly, splenomegaly or masses palpable. No evidence of hernia  Genitourinary:  External: Normal external female genitalia.  Normal urethral meatus, normal Bartholin's and Skene's glands.    Vagina: Normal vaginal mucosa, no evidence of prolapse.    Cervix: Grossly normal in appearance, no bleeding  Uterus: Non-enlarged, mobile, normal contour.  No CMT  Adnexa: ovaries non-enlarged, no adnexal masses  Rectal: deferred  Lymphatic: no evidence of inguinal lymphadenopathy Extremities: no edema, erythema, or tenderness Neurologic: Grossly intact Psychiatric: mood appropriate, affect full  Female chaperone present for pelvic and breast  portions of the physical exam    Assessment: 33 y.o. [id number] routine annual exam  Plan: Problem List Items Addressed This Visit    None    Visit Diagnoses    Encounter for gynecological examination without abnormal finding    -  Primary   Screening for malignant neoplasm of cervix       Relevant Orders   Cytology - PAP   Breast screening       Surveillance for birth control, oral contraceptives          2) STI screening  was notoffered and therefore not obtained  2)  ASCCP guidelines  and rational discussed.  Patient opts for every 3 years screening interval  3) Contraception - the patient is currently using  oral progesterone-only contraceptive.  She is happy with her current form of contraception and plans to continue  4) Routine healthcare maintenance including cholesterol, diabetes screening discussed managed by PCP  5) Return in about 1 year (around 06/11/2020) for annual.   Malachy Mood, MD, [place], [facility] 06/12/2019, 3:48 PM

## 2019-06-19 LAB — CYTOLOGY - PAP
Comment: NEGATIVE
Diagnosis: NEGATIVE
High risk HPV: NEGATIVE

## 2019-08-16 ENCOUNTER — Telehealth: Payer: Self-pay

## 2019-08-16 NOTE — Telephone Encounter (Signed)
Pt aware to check her insurance. Please advise

## 2019-08-16 NOTE — Telephone Encounter (Signed)
Pts insurance will no longer cover current BC after 09/14/19. Would like to know what her options for [facility] are. I called her back and advised to call her insurance or go online to check insurance formulary. She insists msg to be sent to AMS.

## 2019-08-17 ENCOUNTER — Other Ambulatory Visit: Payer: Self-pay | Admitting: Obstetrics and Gynecology

## 2019-08-17 MED ORDER — NORETHINDRONE ACETATE 5 MG PO TABS: 2.5000 mg | ORAL_TABLET | Freq: Every day | ORAL | 3 refills | Status: DC

## 2019-08-17 MED ORDER — NORETHINDRONE ACETATE 5 MG PO TABS: 2.5000 mg | ORAL_TABLET | Freq: Every day | ORAL | 11 refills | Status: DC

## 2019-09-13 ENCOUNTER — Telehealth: Payer: Self-pay

## 2019-09-13 ENCOUNTER — Other Ambulatory Visit: Payer: Self-pay | Admitting: Obstetrics and Gynecology

## 2019-09-13 MED ORDER — NITROFURANTOIN MONOHYD MACRO 100 MG PO CAPS: 100.0000 mg | ORAL_CAPSULE | Freq: Two times a day (BID) | ORAL | 0 refills | Status: AC

## 2019-09-13 NOTE — Telephone Encounter (Signed)
Called pt to see if she can drop off a urine sample this pm, says she cannot. Aware you are of the office today. She insists to send you a msg since last time you saw you said give me a call so I can help/send something in. She is okay to wait and hear back from you tomorrow.

## 2019-09-13 NOTE — Telephone Encounter (Signed)
Pt left msg on triage line asking if AMS could call something in to her pharmacy to what she thinks its a UTI. Started today-has gone twice to bathroom and notice pinkish urine. Denies pelvic/back pain, urinary frequency or burning when urinating. I will have pt drop off urine sample, can you possibly put in urine culture order please.

## 2020-06-09 ENCOUNTER — Encounter: Payer: Self-pay | Admitting: Emergency Medicine

## 2020-06-09 ENCOUNTER — Emergency Department: Payer: BC Managed Care – PPO

## 2020-06-09 ENCOUNTER — Inpatient Hospital Stay
Admission: EM | Admit: 2020-06-09 | Discharge: 2020-06-11 | DRG: 872 | Disposition: A | Discharge status: Home / Self Care | Payer: BC Managed Care – PPO | Attending: Internal Medicine | Admitting: Internal Medicine

## 2020-06-09 ENCOUNTER — Other Ambulatory Visit: Payer: Self-pay

## 2020-06-09 DIAGNOSIS — K5790 Diverticulosis of intestine, part unspecified, without perforation or abscess without bleeding: Secondary | ICD-10-CM | POA: Diagnosis present

## 2020-06-09 DIAGNOSIS — N83202 Unspecified ovarian cyst, left side: Secondary | ICD-10-CM | POA: Diagnosis present

## 2020-06-09 DIAGNOSIS — N200 Calculus of kidney: Secondary | ICD-10-CM | POA: Diagnosis present

## 2020-06-09 DIAGNOSIS — N179 Acute kidney failure, unspecified: Secondary | ICD-10-CM | POA: Diagnosis present

## 2020-06-09 DIAGNOSIS — R590 Localized enlarged lymph nodes: Secondary | ICD-10-CM | POA: Diagnosis present

## 2020-06-09 DIAGNOSIS — N12 Tubulo-interstitial nephritis, not specified as acute or chronic: Secondary | ICD-10-CM | POA: Diagnosis not present

## 2020-06-09 DIAGNOSIS — E860 Dehydration: Secondary | ICD-10-CM | POA: Diagnosis present

## 2020-06-09 DIAGNOSIS — N390 Urinary tract infection, site not specified: Secondary | ICD-10-CM | POA: Diagnosis not present

## 2020-06-09 DIAGNOSIS — Z20822 Contact with and (suspected) exposure to covid-19: Secondary | ICD-10-CM | POA: Diagnosis present

## 2020-06-09 DIAGNOSIS — Z8271 Family history of polycystic kidney: Secondary | ICD-10-CM

## 2020-06-09 DIAGNOSIS — Q613 Polycystic kidney, unspecified: Secondary | ICD-10-CM

## 2020-06-09 DIAGNOSIS — Z885 Allergy status to narcotic agent status: Secondary | ICD-10-CM | POA: Diagnosis not present

## 2020-06-09 DIAGNOSIS — Z888 Allergy status to other drugs, medicaments and biological substances status: Secondary | ICD-10-CM | POA: Diagnosis not present

## 2020-06-09 DIAGNOSIS — A419 Sepsis, unspecified organism: Principal | ICD-10-CM | POA: Diagnosis present

## 2020-06-09 DIAGNOSIS — A4150 Gram-negative sepsis, unspecified: Secondary | ICD-10-CM | POA: Diagnosis present

## 2020-06-09 DIAGNOSIS — E871 Hypo-osmolality and hyponatremia: Secondary | ICD-10-CM | POA: Diagnosis present

## 2020-06-09 DIAGNOSIS — I10 Essential (primary) hypertension: Secondary | ICD-10-CM | POA: Diagnosis present

## 2020-06-09 DIAGNOSIS — N83201 Unspecified ovarian cyst, right side: Secondary | ICD-10-CM | POA: Diagnosis present

## 2020-06-09 DIAGNOSIS — E861 Hypovolemia: Secondary | ICD-10-CM | POA: Diagnosis present

## 2020-06-09 DIAGNOSIS — K760 Fatty (change of) liver, not elsewhere classified: Secondary | ICD-10-CM | POA: Diagnosis present

## 2020-06-09 DIAGNOSIS — B9689 Other specified bacterial agents as the cause of diseases classified elsewhere: Secondary | ICD-10-CM | POA: Diagnosis present

## 2020-06-09 DIAGNOSIS — Z79899 Other long term (current) drug therapy: Secondary | ICD-10-CM | POA: Diagnosis not present

## 2020-06-09 LAB — CBC
HCT: 47 % — ABNORMAL HIGH (ref 36.0–46.0)
Hemoglobin: 15.2 g/dL — ABNORMAL HIGH (ref 12.0–15.0)
MCH: 28.1 pg (ref 26.0–34.0)
MCHC: 32.3 g/dL (ref 30.0–36.0)
MCV: 87 fL (ref 80.0–100.0)
Platelets: 235 10*3/uL (ref 150–400)
RBC: 5.4 MIL/uL — ABNORMAL HIGH (ref 3.87–5.11)
RDW: 13.7 % (ref 11.5–15.5)
WBC: 19.3 10*3/uL — ABNORMAL HIGH (ref 4.0–10.5)
nRBC: 0 % (ref 0.0–0.2)

## 2020-06-09 LAB — COMPREHENSIVE METABOLIC PANEL
ALT: 11 U/L (ref 0–44)
AST: 14 U/L — ABNORMAL LOW (ref 15–41)
Albumin: 3.9 g/dL (ref 3.5–5.0)
Alkaline Phosphatase: 69 U/L (ref 38–126)
Anion gap: 9 (ref 5–15)
BUN: 21 mg/dL — ABNORMAL HIGH (ref 6–20)
CO2: 23 mmol/L (ref 22–32)
Calcium: 8.9 mg/dL (ref 8.9–10.3)
Chloride: 102 mmol/L (ref 98–111)
Creatinine, Ser: 1.68 mg/dL — ABNORMAL HIGH (ref 0.44–1.00)
GFR, Estimated: 40 mL/min — ABNORMAL LOW (ref >60)
Glucose, Bld: 141 mg/dL — ABNORMAL HIGH (ref 70–99)
Potassium: 3.8 mmol/L (ref 3.5–5.1)
Sodium: 134 mmol/L — ABNORMAL LOW (ref 135–145)
Total Bilirubin: 1.1 mg/dL (ref 0.3–1.2)
Total Protein: 7.8 g/dL (ref 6.5–8.1)

## 2020-06-09 LAB — URINALYSIS, COMPLETE (UACMP) WITH MICROSCOPIC
Bilirubin Urine: NEGATIVE
Glucose, UA: NEGATIVE mg/dL
Ketones, ur: NEGATIVE mg/dL
Nitrite: NEGATIVE
Protein, ur: >=300 mg/dL — AB
RBC / HPF: >50 RBC/hpf — ABNORMAL HIGH (ref 0–5)
Specific Gravity, Urine: 1.014 (ref 1.005–1.030)
WBC, UA: >50 WBC/hpf — ABNORMAL HIGH (ref 0–5)
pH: 5 (ref 5.0–8.0)

## 2020-06-09 MED ORDER — ACETAMINOPHEN 325 MG PO TABS
650.0000 mg | ORAL_TABLET | Freq: Four times a day (QID) | ORAL | Status: DC | PRN
  Administered 2020-06-10 – 2020-06-11 (×4): 650 mg via ORAL
  Filled 2020-06-09 (×4): qty 2

## 2020-06-09 MED ORDER — METOPROLOL SUCCINATE ER 25 MG PO TB24
25.0000 mg | ORAL_TABLET | Freq: Every day | ORAL | Status: DC
  Administered 2020-06-10 – 2020-06-11 (×2): 25 mg via ORAL
  Filled 2020-06-09 (×2): qty 1

## 2020-06-09 MED ORDER — MAGNESIUM HYDROXIDE 400 MG/5ML PO SUSP
30.0000 mL | Freq: Every day | ORAL | Status: DC | PRN
  Filled 2020-06-09: qty 30

## 2020-06-09 MED ORDER — SODIUM CHLORIDE 0.9 % IV BOLUS (SEPSIS)
1000.0000 mL | Freq: Once | INTRAVENOUS | Status: AC
  Administered 2020-06-10: 1000 mL via INTRAVENOUS

## 2020-06-09 MED ORDER — SODIUM CHLORIDE 0.9 % IV SOLN
1.0000 g | INTRAVENOUS | Status: DC
  Administered 2020-06-10 (×2): 1 g via INTRAVENOUS
  Filled 2020-06-09 (×2): qty 10
  Filled 2020-06-09: qty 1

## 2020-06-09 MED ORDER — SODIUM CHLORIDE 0.9 % IV BOLUS (SEPSIS)
800.0000 mL | Freq: Once | INTRAVENOUS | Status: AC
  Administered 2020-06-10: 800 mL via INTRAVENOUS

## 2020-06-09 MED ORDER — NORETHINDRONE ACETATE 5 MG PO TABS
2.5000 mg | ORAL_TABLET | Freq: Every day | ORAL | Status: DC
  Administered 2020-06-10: 20:00:00 2.5 mg via ORAL
  Filled 2020-06-09 (×2): qty 1

## 2020-06-09 MED ORDER — ONDANSETRON HCL 4 MG/2ML IJ SOLN
4.0000 mg | Freq: Four times a day (QID) | INTRAMUSCULAR | Status: DC | PRN
  Filled 2020-06-09: qty 2

## 2020-06-09 MED ORDER — ENOXAPARIN SODIUM 40 MG/0.4ML ~~LOC~~ SOLN
40.0000 mg | SUBCUTANEOUS | Status: DC
  Filled 2020-06-09: qty 0.4

## 2020-06-09 MED ORDER — ONDANSETRON HCL 4 MG PO TABS: 4.0000 mg | ORAL_TABLET | Freq: Four times a day (QID) | ORAL | Status: DC | PRN

## 2020-06-09 MED ORDER — ACETAMINOPHEN 650 MG RE SUPP: 650.0000 mg | Freq: Four times a day (QID) | RECTAL | Status: DC | PRN

## 2020-06-09 MED ORDER — TRAZODONE HCL 50 MG PO TABS
25.0000 mg | ORAL_TABLET | Freq: Every evening | ORAL | Status: DC | PRN
  Administered 2020-06-10: 25 mg via ORAL
  Filled 2020-06-09: qty 1

## 2020-06-09 NOTE — H&P (Signed)
[place]   PATIENT NAME: Alice Harrell    MR#:  [id number]  DATE OF BIRTH:  09-Jan-1986  DATE OF ADMISSION:  06/09/2020  PRIMARY CARE PHYSICIAN: Patient, No Pcp Per   REQUESTING/REFERRING PHYSICIAN: Dorothea Glassman, MD  CHIEF COMPLAINT:   Chief Complaint  Patient presents with  . Flank Pain    HISTORY OF PRESENT ILLNESS:  Alice Harrell  is a 34 y.o. Caucasian female with a known history of hypertension and polycystic kidney disease as well as the gestational diabetes, who presented to emergency room with acute onset of dysuria that started on 12/23 for which the patient to remaining 2 pills of antibiotics that were given earlier this year and felt slightly better however later on over the last few days she has been starting to develop worsening lower abdominal pressure as well as bilateral flank pain.  She admitted to associated nausea and vomiting.  She developed fever and chills today.  She denied any significant urinary frequency urgency.  No chest pain or dyspnea or cough.  Upon presentation to the emergency room, temperature was 100.2, blood pressure was 150/97 heart rate 112 with otherwise normal vital signs.  Labs revealed mild hyponatremia and a BUN of 21 with creatinine 1.68 compared to 14/0.97 in 2017.  CBC shows significant leukocytosis of 19.3.  UA was strongly positive for UTI.  CT renal stone revealed bilateral nonobstructing nephrolithiasis and finding consistent with polycystic kidney disease as below: 1. Bilateral nonobstructing nephrolithiasis. 2. Findings are consistent with polycystic kidney disease. There are indeterminate bilateral renal nodules, all favored to represent proteinaceous or hemorrhagic cysts. Consider further evaluation with an outpatient renal ultrasound or MRI. 3. Hepatic steatosis. 4. Rectosigmoid diverticulosis without acute inflammation. 5. Mildly enlarged retroperitoneal lymph nodes, likely reactive. 6. Complex appearing  bilateral ovarian masses. These are favored to represent hemorrhagic cysts. Follow-up with an outpatient pelvic ultrasound in 6-12 weeks is recommended for further evaluation of both lesions. 7. Trace bilateral pleural effusions.  The patient will be started on IV normal saline bolus and IV Rocephin.  Should be admitted to a medical monitored bed for further evaluation and management PAST MEDICAL HISTORY:   Past Medical History:  Diagnosis Date  . Gestational diabetes   . Hypertension   . Polycystic kidney disease     PAST SURGICAL HISTORY:   Past Surgical History:  Procedure Laterality Date  . CESAREAN SECTION N/A 03/04/2016   Procedure: CESAREAN SECTION;  Surgeon: Nadara Mustard, MD;  Location: [facility] ORS;  Service: Obstetrics;  Laterality: N/A;  . CESAREAN SECTION      SOCIAL HISTORY:   Social History   Tobacco Use  . Smoking status: Never Smoker  . Smokeless tobacco: Never Used  Substance Use Topics  . Alcohol use: No    FAMILY HISTORY:  No family history on file.  DRUG ALLERGIES:   Allergies  Allergen Reactions  . Amlodipine   . Morphine And Related Itching    REVIEW OF SYSTEMS:   ROS As per history of present illness. All pertinent systems were reviewed above. Constitutional, HEENT, cardiovascular, respiratory, GI, GU, musculoskeletal, neuro, psychiatric, endocrine, integumentary and hematologic systems were reviewed and are otherwise negative/unremarkable except for positive findings mentioned above in the HPI.   MEDICATIONS AT HOME:   Prior to Admission medications   Medication Sig Start Date End Date Taking? Authorizing Provider  metoprolol succinate (TOPROL-XL) 25 MG 24 hr tablet Take 1 tablet (25 mg total) by mouth daily. 06/12/19  Vena Austria, MD  norethindrone (AYGESTIN) 5 MG tablet Take 0.5 tablets (2.5 mg total) by mouth daily. 08/17/19   Vena Austria, MD      VITAL SIGNS:  Blood pressure (!) 150/97, pulse (!) 112, temperature  100.2 F (37.9 C), temperature source Oral, resp. rate 18, height 5\' 4"  (1.626 m), weight 93 kg, SpO2 96 %.  PHYSICAL EXAMINATION:  Physical Exam  GENERAL:  34 y.o.-year-old Caucasian female patient lying in the bed with mild distress from pain.   EYES: Pupils equal, round, reactive to light and accommodation. No scleral icterus. Extraocular muscles intact.  HEENT: Head atraumatic, normocephalic. Oropharynx and nasopharynx clear.  NECK:  Supple, no jugular venous distention. No thyroid enlargement, no tenderness.  LUNGS: Normal breath sounds bilaterally, no wheezing, rales,rhonchi or crepitation. No use of accessory muscles of respiration.  CARDIOVASCULAR: Regular rate and rhythm, S1, S2 normal. No murmurs, rubs, or gallops.  ABDOMEN: Soft, nondistended with bilateral CVA tenderness. Bowel sounds present. No organomegaly or mass.  EXTREMITIES: No pedal edema, cyanosis, or clubbing.  NEUROLOGIC: Cranial nerves II through XII are intact. Muscle strength 5/5 in all extremities. Sensation intact. Gait not checked.  PSYCHIATRIC: The patient is alert and oriented x 3.  Normal affect and good eye contact. SKIN: No obvious rash, lesion, or ulcer.   LABORATORY PANEL:   CBC Recent Labs  Lab 06/09/20 2052  WBC 19.3*  HGB 15.2*  HCT 47.0*  PLT 235   ------------------------------------------------------------------------------------------------------------------  Chemistries  Recent Labs  Lab 06/09/20 2052  NA 134*  K 3.8  CL 102  CO2 23  GLUCOSE 141*  BUN 21*  CREATININE 1.68*  CALCIUM 8.9  AST 14*  ALT 11  ALKPHOS 69  BILITOT 1.1   ------------------------------------------------------------------------------------------------------------------  Cardiac Enzymes No results for input(s): TROPONINI in the last 168 hours. ------------------------------------------------------------------------------------------------------------------  RADIOLOGY:  CT Renal Stone  Study  Result Date: 06/09/2020 CLINICAL DATA:  Flank pain. Kidney stones suspected. Lower abdominal pressure with pain. EXAM: CT ABDOMEN AND PELVIS WITHOUT CONTRAST TECHNIQUE: Multidetector CT imaging of the abdomen and pelvis was performed following the standard protocol without IV contrast. COMPARISON:  CT dated February 22, 2007 FINDINGS: Lower chest: There are trace bilateral pleural effusions.The heart size is normal. Hepatobiliary: There is decreased hepatic attenuation suggestive of hepatic steatosis. Normal gallbladder.There is no biliary ductal dilation. Pancreas: Normal contours without ductal dilatation. No peripancreatic fluid collection. Spleen: Unremarkable. Adrenals/Urinary Tract: --Adrenal glands: Unremarkable. --Right kidney/ureter: Innumerable large cysts are noted throughout the right kidney, some of which are hyperdense. There is a large nonobstructing stone in the lower pole. Smaller nonobstructing stones are noted. The right kidney is overall enlarged. --Left kidney/ureter: Multiple large simple and complex cysts are noted throughout the left kidney. There is no left-sided hydronephrosis. There are few nonobstructing stones. The left kidney is overall enlarged. --Urinary bladder: Unremarkable. Stomach/Bowel: --Stomach/Duodenum: No hiatal hernia or other gastric abnormality. Normal duodenal course and caliber. --Small bowel: Unremarkable. --Colon: Rectosigmoid diverticulosis without acute inflammation. --Appendix: Normal. Vascular/Lymphatic: Normal course and caliber of the major abdominal vessels. --there are mildly enlarged retroperitoneal lymph nodes, likely reactive. --No mesenteric lymphadenopathy. --No pelvic or inguinal lymphadenopathy. Reproductive: There are complex appearing bilateral ovarian masses. On the right the mass measures approximately 2.2 cm. On the left it measures 2.4 cm. Other: No ascites or free air. The abdominal wall is normal. Musculoskeletal. No acute displaced  fractures. IMPRESSION: 1. Bilateral nonobstructing nephrolithiasis. 2. Findings are consistent with polycystic kidney disease. There are indeterminate bilateral renal nodules, all  favored to represent proteinaceous or hemorrhagic cysts. Consider further evaluation with an outpatient renal ultrasound or MRI. 3. Hepatic steatosis. 4. Rectosigmoid diverticulosis without acute inflammation. 5. Mildly enlarged retroperitoneal lymph nodes, likely reactive. 6. Complex appearing bilateral ovarian masses. These are favored to represent hemorrhagic cysts. Follow-up with an outpatient pelvic ultrasound in 6-12 weeks is recommended for further evaluation of both lesions. 7. Trace bilateral pleural effusions. Electronically Signed   By: Katherine Mantle M.D.   On: 06/09/2020 21:42      IMPRESSION AND PLAN:   1.  Sepsis secondary to UTI likely with bilateral pyelonephritis in the setting of bilateral nonobstructing nephrolithiasis and polycystic kidney disease.  Sepsis manifested by significant leukocytosis and associated tachycardia.  She has no evidence for severe sepsis or septic shock. -The patient will be admitted to the medical monitored bed. -She will be continued on hydration with IV normal saline with a bolus followed by infusion. -We will continue IV antibiotic therapy with IV Rocephin. -We will follow urine and blood cultures. -Pain management will be provided. -Nephrology consultation will be obtained. -I notified Dr. Mena Goes about the patient.  2.  Acute kidney injury. -This is likely prerenal secondary to volume depletion and dehydration as there is no clear evidence for obstruction. -She will be hydrated with IV normal saline. -We will follow BMPs. -We will avoid nephrotoxins.  3.  Mild hyponatremia, likely hypovolemic. -As above she will be hydrated with IV normal saline and will follow her sodium level.  4.  Essential hypertension. -We will continue Toprol-XL.  5.  DVT  prophylaxis. -Subcutaneous Lovenox.   All the records are reviewed and case discussed with ED provider. The plan of care was discussed in details with the patient (and family). I answered all questions. The patient agreed to proceed with the above mentioned plan. Further management will depend upon hospital course.   CODE STATUS: Full code  Status is: Inpatient  Remains inpatient appropriate because:Ongoing active pain requiring inpatient pain management, Ongoing diagnostic testing needed not appropriate for outpatient work up, Unsafe d/c plan, IV treatments appropriate due to intensity of illness or inability to take PO and Inpatient level of care appropriate due to severity of illness   Dispo: The patient is from: Home              Anticipated d/c is to: Home              Anticipated d/c date is: 2 days              Patient currently is not medically stable to d/c.    TOTAL TIME TAKING CARE OF THIS PATIENT: 50 minutes.    Hannah Beat M.D on 06/09/2020 at 11:45 PM  [facility]   From 7 PM-7 AM, contact night-coverage [email or web address]  CC: Primary care physician; Patient, No Pcp Per

## 2020-06-09 NOTE — ED Triage Notes (Signed)
Patient states that she started having lower abdominal pressure and pain with urination two days ago. Patient states that she thought she had a UTI and she had 2 days worth of medication left over from a previous UTI. Patient states that she took that medication. Patient states that today the pain has moved into her right lower back. Patient states that she has a history of kidney stones and that this pain feels the same.

## 2020-06-09 NOTE — ED Provider Notes (Signed)
[facility] Emergency Department Provider Note   ____________________________________________   Event Date/Time   First MD Initiated Contact with Patient 06/09/20 2330     (approximate)  I have reviewed the triage vital signs and the nursing notes.   HISTORY  Chief Complaint Flank Pain    HPI Alice Harrell is a 34 y.o. female who reports she started having lower abdominal pain and pressure with urination about 2 days ago she took some of her old UTI meds because she thought she had a UTI now the abdominal pain is better but she is having pain in the back especially on the right side around the CVA area.  She has a fever and tachycardia and white count on lab work.  Her GFR has dropped from 60-40 creatinine is gone up.  Dr. Rosita Kea he came by while I was reviewing the labs and asked if there were any admissions I told him about this lady he is working on admitting the patient and will put in orders for antibiotics and fluids.        Past Medical History:  Diagnosis Date  . Gestational diabetes   . Hypertension   . Polycystic kidney disease     Patient Active Problem List   Diagnosis Date Noted  . Sepsis due to gram-negative UTI (HCC) 06/09/2020  . Gestational hypertension 03/04/2016  . Gestational diabetes 03/04/2016  . History of cesarean delivery 03/04/2016  . Obesity affecting pregnancy in third trimester 03/04/2016  . Preeclampsia 03/04/2016    Past Surgical History:  Procedure Laterality Date  . CESAREAN SECTION N/A 03/04/2016   Procedure: CESAREAN SECTION;  Surgeon: Nadara Mustard, MD;  Location: [facility] ORS;  Service: Obstetrics;  Laterality: N/A;  . CESAREAN SECTION      Prior to Admission medications   Medication Sig Start Date End Date Taking? Authorizing Provider  metoprolol succinate (TOPROL-XL) 25 MG 24 hr tablet Take 1 tablet (25 mg total) by mouth daily. 06/12/19   Vena Austria, MD  norethindrone (AYGESTIN) 5 MG tablet  Take 0.5 tablets (2.5 mg total) by mouth daily. 08/17/19   Vena Austria, MD    Allergies Amlodipine and Morphine and related  No family history on file.  Social History Social History   Tobacco Use  . Smoking status: Never Smoker  . Smokeless tobacco: Never Used  Vaping Use  . Vaping Use: Never used  Substance Use Topics  . Alcohol use: No  . Drug use: No    Review of Systems  Constitutional:  fever/chills Eyes: No visual changes. ENT: No sore throat. Cardiovascular: Denies chest pain. Respiratory: Denies shortness of breath. Gastrointestinal: Some abdominal pain.  No nausea, no vomiting.  No diarrhea.  No constipation. Genitourinary: Negative for dysuria. Musculoskeletal:back pain especially right CVA area. Skin: Negative for rash. Neurological: Negative for headaches, focal weakness  ____________________________________________   PHYSICAL EXAM:  VITAL SIGNS: ED Triage Vitals  Enc Vitals Group     BP 06/09/20 2045 (!) 150/97     Pulse Rate 06/09/20 2045 (!) 112     Resp 06/09/20 2045 18     Temp 06/09/20 2045 100.2 F (37.9 C)     Temp Source 06/09/20 2045 Oral     SpO2 06/09/20 2045 96 %     Weight 06/09/20 2046 205 lb (93 kg)     Height 06/09/20 2046 5\' 4"  (1.626 m)     Head Circumference --      Peak Flow --  Pain Score 06/09/20 2045 8     Pain Loc --      Pain Edu? --      Excl. in GC? --     Constitutional: Alert and oriented. Well appearing and in no acute distress. Eyes: Conjunctivae are normal.  Head: Atraumatic. Nose: No congestion/rhinnorhea. Mouth/Throat: Mucous membranes are moist.  Oropharynx non-erythematous. Neck: No stridor.  Cardiovascular: Normal rate, regular rhythm. Grossly normal heart sounds.  Good peripheral circulation. Respiratory: Normal respiratory effort.  No retractions. Lungs CTAB. Gastrointestinal: Soft and nontender. No distention. No abdominal bruits.  Some right CVA tenderness. Musculoskeletal: No lower  extremity tenderness nor edema.   Neurologic:  Normal speech and language. No gross focal neurologic deficits are appreciated.  Skin:  Skin is warm, dry and intact. No rash noted.   ____________________________________________   LABS (all labs ordered are listed, but only abnormal results are displayed)  Labs Reviewed  COMPREHENSIVE METABOLIC PANEL - Abnormal; Notable for the following components:      Result Value   Sodium 134 (*)    Glucose, Bld 141 (*)    BUN 21 (*)    Creatinine, Ser 1.68 (*)    AST 14 (*)    GFR, Estimated 40 (*)    All other components within normal limits  CBC - Abnormal; Notable for the following components:   WBC 19.3 (*)    RBC 5.40 (*)    Hemoglobin 15.2 (*)    HCT 47.0 (*)    All other components within normal limits  URINALYSIS, COMPLETE (UACMP) WITH MICROSCOPIC - Abnormal; Notable for the following components:   Color, Urine YELLOW (*)    APPearance CLOUDY (*)    Hgb urine dipstick LARGE (*)    Protein, ur >=300 (*)    Leukocytes,Ua LARGE (*)    RBC / HPF >50 (*)    WBC, UA >50 (*)    Bacteria, UA RARE (*)    All other components within normal limits   ____________________________________________  EKG   ____________________________________________  RADIOLOGY Jill Poling, personally viewed and evaluated these images (plain radiographs) as part of my medical decision making, as well as reviewing the written report by the radiologist.  ED MD interpretation: CT read by radiology reviewed by me shows no obstruction of the kidneys bilateral stones bilateral renal cysts and ovarian cysts  Official radiology report(s): CT Renal Stone Study  Result Date: 06/09/2020 CLINICAL DATA:  Flank pain. Kidney stones suspected. Lower abdominal pressure with pain. EXAM: CT ABDOMEN AND PELVIS WITHOUT CONTRAST TECHNIQUE: Multidetector CT imaging of the abdomen and pelvis was performed following the standard protocol without IV contrast. COMPARISON:   CT dated February 22, 2007 FINDINGS: Lower chest: There are trace bilateral pleural effusions.The heart size is normal. Hepatobiliary: There is decreased hepatic attenuation suggestive of hepatic steatosis. Normal gallbladder.There is no biliary ductal dilation. Pancreas: Normal contours without ductal dilatation. No peripancreatic fluid collection. Spleen: Unremarkable. Adrenals/Urinary Tract: --Adrenal glands: Unremarkable. --Right kidney/ureter: Innumerable large cysts are noted throughout the right kidney, some of which are hyperdense. There is a large nonobstructing stone in the lower pole. Smaller nonobstructing stones are noted. The right kidney is overall enlarged. --Left kidney/ureter: Multiple large simple and complex cysts are noted throughout the left kidney. There is no left-sided hydronephrosis. There are few nonobstructing stones. The left kidney is overall enlarged. --Urinary bladder: Unremarkable. Stomach/Bowel: --Stomach/Duodenum: No hiatal hernia or other gastric abnormality. Normal duodenal course and caliber. --Small bowel: Unremarkable. --Colon: Rectosigmoid diverticulosis  without acute inflammation. --Appendix: Normal. Vascular/Lymphatic: Normal course and caliber of the major abdominal vessels. --there are mildly enlarged retroperitoneal lymph nodes, likely reactive. --No mesenteric lymphadenopathy. --No pelvic or inguinal lymphadenopathy. Reproductive: There are complex appearing bilateral ovarian masses. On the right the mass measures approximately 2.2 cm. On the left it measures 2.4 cm. Other: No ascites or free air. The abdominal wall is normal. Musculoskeletal. No acute displaced fractures. IMPRESSION: 1. Bilateral nonobstructing nephrolithiasis. 2. Findings are consistent with polycystic kidney disease. There are indeterminate bilateral renal nodules, all favored to represent proteinaceous or hemorrhagic cysts. Consider further evaluation with an outpatient renal ultrasound or MRI. 3.  Hepatic steatosis. 4. Rectosigmoid diverticulosis without acute inflammation. 5. Mildly enlarged retroperitoneal lymph nodes, likely reactive. 6. Complex appearing bilateral ovarian masses. These are favored to represent hemorrhagic cysts. Follow-up with an outpatient pelvic ultrasound in 6-12 weeks is recommended for further evaluation of both lesions. 7. Trace bilateral pleural effusions. Electronically Signed   By: Katherine Mantle M.D.   On: 06/09/2020 21:42    ____________________________________________   PROCEDURES  Procedure(s) performed (including Critical Care):  Procedures   ____________________________________________   INITIAL IMPRESSION / ASSESSMENT AND PLAN / ED COURSE  Patient with decreased GFR elevated white count fever UTI symptoms we will get the patient in the hospital Dr. Arville Care is already putting in the orders.  We will put in fluids and antibiotics have discussed this with him.              ____________________________________________   FINAL CLINICAL IMPRESSION(S) / ED DIAGNOSES  Final diagnoses:  Pyelonephritis  Early sepsis     ED Discharge Orders    None      *Please note:  Alice Harrell was evaluated in Emergency Department on 06/09/2020 for the symptoms described in the history of present illness. She was evaluated in the context of the global COVID-19 pandemic, which necessitated consideration that the patient might be at risk for infection with the SARS-CoV-2 virus that causes COVID-19. Institutional protocols and algorithms that pertain to the evaluation of patients at risk for COVID-19 are in a state of rapid change based on information released by regulatory bodies including the CDC and federal and state organizations. These policies and algorithms were followed during the patient's care in the ED.  Some ED evaluations and interventions may be delayed as a result of limited staffing during and the pandemic.*   Note:  This  document was prepared using Dragon voice recognition software and may include unintentional dictation errors.    Arnaldo Natal, MD 06/09/20 [id number]

## 2020-06-10 DIAGNOSIS — N12 Tubulo-interstitial nephritis, not specified as acute or chronic: Secondary | ICD-10-CM

## 2020-06-10 DIAGNOSIS — A4150 Gram-negative sepsis, unspecified: Secondary | ICD-10-CM

## 2020-06-10 DIAGNOSIS — N390 Urinary tract infection, site not specified: Secondary | ICD-10-CM

## 2020-06-10 LAB — CBC
HCT: 40.8 % (ref 36.0–46.0)
Hemoglobin: 13.1 g/dL (ref 12.0–15.0)
MCH: 28.5 pg (ref 26.0–34.0)
MCHC: 32.1 g/dL (ref 30.0–36.0)
MCV: 88.7 fL (ref 80.0–100.0)
Platelets: 184 10*3/uL (ref 150–400)
RBC: 4.6 MIL/uL (ref 3.87–5.11)
RDW: 13.9 % (ref 11.5–15.5)
WBC: 17.7 10*3/uL — ABNORMAL HIGH (ref 4.0–10.5)
nRBC: 0 % (ref 0.0–0.2)

## 2020-06-10 LAB — RESP PANEL BY RT-PCR (FLU A&B, COVID) ARPGX2
Influenza A by PCR: NEGATIVE
Influenza B by PCR: NEGATIVE
SARS Coronavirus 2 by RT PCR: NEGATIVE

## 2020-06-10 LAB — RENAL FUNCTION PANEL
Albumin: 3.1 g/dL — ABNORMAL LOW (ref 3.5–5.0)
Anion gap: 10 (ref 5–15)
BUN: 27 mg/dL — ABNORMAL HIGH (ref 6–20)
CO2: 23 mmol/L (ref 22–32)
Calcium: 8.3 mg/dL — ABNORMAL LOW (ref 8.9–10.3)
Chloride: 103 mmol/L (ref 98–111)
Creatinine, Ser: 2.11 mg/dL — ABNORMAL HIGH (ref 0.44–1.00)
GFR, Estimated: 31 mL/min — ABNORMAL LOW (ref >60)
Glucose, Bld: 109 mg/dL — ABNORMAL HIGH (ref 70–99)
Phosphorus: 3.4 mg/dL (ref 2.5–4.6)
Potassium: 3.8 mmol/L (ref 3.5–5.1)
Sodium: 136 mmol/L (ref 135–145)

## 2020-06-10 LAB — BASIC METABOLIC PANEL
Anion gap: 6 (ref 5–15)
BUN: 20 mg/dL (ref 6–20)
CO2: 23 mmol/L (ref 22–32)
Calcium: 7.9 mg/dL — ABNORMAL LOW (ref 8.9–10.3)
Chloride: 105 mmol/L (ref 98–111)
Creatinine, Ser: 1.74 mg/dL — ABNORMAL HIGH (ref 0.44–1.00)
GFR, Estimated: 39 mL/min — ABNORMAL LOW (ref >60)
Glucose, Bld: 132 mg/dL — ABNORMAL HIGH (ref 70–99)
Potassium: 3.9 mmol/L (ref 3.5–5.1)
Sodium: 134 mmol/L — ABNORMAL LOW (ref 135–145)

## 2020-06-10 LAB — HIV ANTIBODY (ROUTINE TESTING W REFLEX): HIV Screen 4th Generation wRfx: NONREACTIVE

## 2020-06-10 LAB — PROCALCITONIN: Procalcitonin: 1 ng/mL

## 2020-06-10 LAB — PROTIME-INR
INR: 1.3 — ABNORMAL HIGH (ref 0.8–1.2)
Prothrombin Time: 15.9 seconds — ABNORMAL HIGH (ref 11.4–15.2)

## 2020-06-10 LAB — CORTISOL-AM, BLOOD: Cortisol - AM: 31.5 ug/dL — ABNORMAL HIGH (ref 6.7–22.6)

## 2020-06-10 MED ORDER — FENTANYL CITRATE (PF) 100 MCG/2ML IJ SOLN
INTRAMUSCULAR | Status: AC
  Filled 2020-06-10: qty 2

## 2020-06-10 MED ORDER — FENTANYL CITRATE (PF) 100 MCG/2ML IJ SOLN
50.0000 ug | Freq: Once | INTRAMUSCULAR | Status: AC
  Administered 2020-06-10: 50 ug via INTRAVENOUS

## 2020-06-10 MED ORDER — HYDROMORPHONE HCL 1 MG/ML IJ SOLN
1.0000 mg | INTRAMUSCULAR | Status: DC | PRN
  Administered 2020-06-10 (×3): 1 mg via INTRAVENOUS
  Filled 2020-06-10 (×3): qty 1

## 2020-06-10 MED ORDER — LACTATED RINGERS IV SOLN: INTRAVENOUS | Status: AC

## 2020-06-10 MED ORDER — KETOROLAC TROMETHAMINE 30 MG/ML IJ SOLN
30.0000 mg | Freq: Once | INTRAMUSCULAR | Status: AC
  Administered 2020-06-10: 09:00:00 30 mg via INTRAVENOUS
  Filled 2020-06-10: qty 1

## 2020-06-10 MED ORDER — OXYCODONE-ACETAMINOPHEN 7.5-325 MG PO TABS
1.0000 | ORAL_TABLET | ORAL | Status: DC | PRN
Start: 2020-06-10 — End: 2020-06-11
  Administered 2020-06-10 – 2020-06-11 (×3): 1 via ORAL
  Filled 2020-06-10 (×3): qty 1

## 2020-06-10 NOTE — Plan of Care (Signed)

## 2020-06-10 NOTE — Consult Note (Signed)
34 y.o. female with polycystic kidney disease admitted for UTI/pyelonephritis.  CT did show bilateral renal calculi and I was asked by Dr. Nelson Chimes to review her CT to see if any intervention needed.  The CT images were reviewed and her bilateral renal calculi are all nonobstructing.  No ureteral calculi or hydroureter.  Multiple renal cysts some which are slightly hyperdense  There is no need for urgent intervention/stenting as her renal calculi are nonobstructing.  Would continue IV antibiotics and will be happy to see the patient in follow-up after discharge.  If she does not improve clinically or there are any changes we will be happy to see as a formal consult.  Please let me know when the patient will be discharged and will arrange for a follow-up visit.

## 2020-06-10 NOTE — ED Notes (Signed)
Covid swab collected

## 2020-06-10 NOTE — Progress Notes (Signed)
PROGRESS NOTE    Alice Harrell  OZY:[id number] DOB: August 27, 1985 DOA: 06/09/2020 PCP: Patient, No Pcp Per   Brief Narrative: Taken from H&P. Alice Harrell  is a 34 y.o. Caucasian female with a known history of hypertension and polycystic kidney disease as well as the gestational diabetes, who presented to emergency room with acute onset of dysuria that started on 12/23 for which the patient to remaining 2 pills of antibiotics that were given earlier this year and felt slightly better however later on over the last few days she has been starting to develop worsening lower abdominal pressure as well as bilateral flank pain.  She admitted to associated nausea and vomiting.  She developed fever and chills today.  She denied any significant urinary frequency urgency.  No chest pain or dyspnea or cough. She was febrile at 100.2, with leukocytosis, tachycardia and elevated creatinine (unknown baseline).  CT renal stone studies with bilateral nonobstructing nephrolithiasis and polycystic kidney disease. Incidental finding of bilateral ovarian hemorrhagic cyst which will need an outpatient evaluation in 6 to 12 weeks by a gynecologist.  Subjective: Patient continued to have significant flank pain when seen today.,  Stating Dilaudid only work for 1 hour and asking for more pain meds.  Husband at bedside. Patient is aware of polycystic kidney disease, stating that multiple family members with similar disease.  She did not follow-up with any nephrologist and renal functions has not been checked for very long time.  Assessment & Plan:   Active Problems:   Sepsis due to gram-negative UTI ([place])  Sepsis secondary to UTI/pyelonephritis.  Met sepsis criteria on admission with being febrile, leukocytosis, tachycardia and UA looks infected.  Received IV fluid and started on ceftriaxone.  No cultures were done. Continue to have bilateral flank pain, right worse than left. -Add on urine culture. -Send blood  cultures-patient already received 1 dose of ceftriaxone. -Continue ceftriaxone. -Give her 1 dose of Toradol. -Continue with pain management. -Urology consult  Elevated creatinine/polycystic kidneys.  AKI versus CKD as patient has polycystic kidney disease.  Does not follow-up with physician currently.  Strong family history of polycystic kidney disease.  Creatinine was within normal limit 4 years ago per chart review.  No azotemia today.  Received IV fluid which did not show any significant improvement. -Continue IV hydration for another day. -Continue to monitor renal function. -Avoid nephrotoxins. -Will need outpatient nephrology follow-up.  Bilateral ovarian hemorrhagic cyst.  Can be with polycystic kidney disease. -Patient will need a gynecologic evaluation and a pelvic ultrasound in 6 to 12 weeks.  Discussed with patient and her husband.  Hypertension.  Blood pressure within goal. -Continue home dose of metoprolol.   Objective: Vitals:   06/10/20 0437 06/10/20 0701 06/10/20 0914 06/10/20 1125  BP:    120/78  Pulse: (!) 101   100  Resp:      Temp:  100.1 F (37.8 C) 98.5 F (36.9 C)   TempSrc:  Oral Oral   SpO2: 98%     Weight:      Height:        Intake/Output Summary (Last 24 hours) at 06/10/2020 1208 Last data filed at 06/10/2020 0233 Gross per 24 hour  Intake 100 ml  Output --  Net 100 ml   Filed Weights   06/09/20 2046  Weight: 93 kg    Examination:  General exam: Appears calm and comfortable  Respiratory system: Clear to auscultation. Respiratory effort normal. Cardiovascular system: S1 & S2 heard, RRR.  Gastrointestinal  system: Soft, nontender, nondistended, bowel sounds positive.  Bilateral CVA tenderness. Central nervous system: Alert and oriented. No focal neurological deficits. Extremities: No edema, no cyanosis, pulses intact and symmetrical. Skin: No rashes, lesions or ulcers Psychiatry: Judgement and insight appear normal. Mood & affect  appropriate.    DVT prophylaxis: Lovenox Code Status: Full Family Communication: Husband was updated at bedside. Disposition Plan:  Status is: Inpatient  Remains inpatient appropriate because:Inpatient level of care appropriate due to severity of illness   Dispo: The patient is from: Home              Anticipated d/c is to: Home              Anticipated d/c date is: 2 days              Patient currently is not medically stable to d/c.   Consultants:   urology  Procedures:  Antimicrobials:  Ceftriaxone  Data Reviewed: I have personally reviewed following labs and imaging studies  CBC: Recent Labs  Lab 06/09/20 2052 06/10/20 0437  WBC 19.3* 17.7*  HGB 15.2* 13.1  HCT 47.0* 40.8  MCV 87.0 88.7  PLT 235 970   Basic Metabolic Panel: Recent Labs  Lab 06/09/20 2052 06/10/20 0437  NA 134* 134*  K 3.8 3.9  CL 102 105  CO2 23 23  GLUCOSE 141* 132*  BUN 21* 20  CREATININE 1.68* 1.74*  CALCIUM 8.9 7.9*   GFR: Estimated Creatinine Clearance: 49.9 mL/min (A) (by C-G formula based on SCr of 1.74 mg/dL (H)). Liver Function Tests: Recent Labs  Lab 06/09/20 2052  AST 14*  ALT 11  ALKPHOS 69  BILITOT 1.1  PROT 7.8  ALBUMIN 3.9   No results for input(s): LIPASE, AMYLASE in the last 168 hours. No results for input(s): AMMONIA in the last 168 hours. Coagulation Profile: Recent Labs  Lab 06/10/20 0437  INR 1.3*   Cardiac Enzymes: No results for input(s): CKTOTAL, CKMB, CKMBINDEX, TROPONINI in the last 168 hours. BNP (last 3 results) No results for input(s): PROBNP in the last 8760 hours. HbA1C: No results for input(s): HGBA1C in the last 72 hours. CBG: No results for input(s): GLUCAP in the last 168 hours. Lipid Profile: No results for input(s): CHOL, HDL, LDLCALC, TRIG, CHOLHDL, LDLDIRECT in the last 72 hours. Thyroid Function Tests: No results for input(s): TSH, T4TOTAL, FREET4, T3FREE, THYROIDAB in the last 72 hours. Anemia Panel: No results for  input(s): VITAMINB12, FOLATE, FERRITIN, TIBC, IRON, RETICCTPCT in the last 72 hours. Sepsis Labs: Recent Labs  Lab 06/10/20 0437  PROCALCITON 1.00    Recent Results (from the past 240 hour(s))  Resp Panel by RT-PCR (Flu A&B, Covid) Nasopharyngeal Swab     Status: None   Collection Time: 06/10/20 10:00 AM   Specimen: Nasopharyngeal Swab; Nasopharyngeal(NP) swabs in vial transport medium  Result Value Ref Range Status   SARS Coronavirus 2 by RT PCR NEGATIVE NEGATIVE Final    Comment: (NOTE) SARS-CoV-2 target nucleic acids are NOT DETECTED.  The SARS-CoV-2 RNA is generally detectable in upper respiratory specimens during the acute phase of infection. The lowest concentration of SARS-CoV-2 viral copies this assay can detect is 138 copies/mL. A negative result does not preclude SARS-Cov-2 infection and should not be used as the sole basis for treatment or other patient management decisions. A negative result may occur with  improper specimen collection/handling, submission of specimen other than nasopharyngeal swab, presence of viral mutation(s) within the areas targeted by this assay,  and inadequate number of viral copies(<138 copies/mL). A negative result must be combined with clinical observations, patient history, and epidemiological information. The expected result is Negative.  Fact Sheet for Patients:  EntrepreneurPulse.com.au  Fact Sheet for Healthcare Providers:  IncredibleEmployment.be  This test is no t yet approved or cleared by the [place] FDA and  has been authorized for detection and/or diagnosis of SARS-CoV-2 by FDA under an Emergency Use Authorization (EUA). This EUA will remain  in effect (meaning this test can be used) for the duration of the COVID-19 declaration under Section 564(b)(1) of the Act, 21 U.S.C.section 360bbb-3(b)(1), unless the authorization is terminated  or revoked sooner.       Influenza A by PCR  NEGATIVE NEGATIVE Final   Influenza B by PCR NEGATIVE NEGATIVE Final    Comment: (NOTE) The Xpert Xpress SARS-CoV-2/FLU/RSV plus assay is intended as an aid in the diagnosis of influenza from Nasopharyngeal swab specimens and should not be used as a sole basis for treatment. Nasal washings and aspirates are unacceptable for Xpert Xpress SARS-CoV-2/FLU/RSV testing.  Fact Sheet for Patients: EntrepreneurPulse.com.au  Fact Sheet for Healthcare Providers: IncredibleEmployment.be  This test is not yet approved or cleared by the [place] FDA and has been authorized for detection and/or diagnosis of SARS-CoV-2 by FDA under an Emergency Use Authorization (EUA). This EUA will remain in effect (meaning this test can be used) for the duration of the COVID-19 declaration under Section 564(b)(1) of the Act, 21 U.S.C. section 360bbb-3(b)(1), unless the authorization is terminated or revoked.  Performed at [facility], [address], [place], [place] [address]      Radiology Studies: CT Renal Stone Study  Result Date: 06/09/2020 CLINICAL DATA:  Flank pain. Kidney stones suspected. Lower abdominal pressure with pain. EXAM: CT ABDOMEN AND PELVIS WITHOUT CONTRAST TECHNIQUE: Multidetector CT imaging of the abdomen and pelvis was performed following the standard protocol without IV contrast. COMPARISON:  CT dated February 22, 2007 FINDINGS: Lower chest: There are trace bilateral pleural effusions.The heart size is normal. Hepatobiliary: There is decreased hepatic attenuation suggestive of hepatic steatosis. Normal gallbladder.There is no biliary ductal dilation. Pancreas: Normal contours without ductal dilatation. No peripancreatic fluid collection. Spleen: Unremarkable. Adrenals/Urinary Tract: --Adrenal glands: Unremarkable. --Right kidney/ureter: Innumerable large cysts are noted throughout the right kidney, some of which are hyperdense. There is a  large nonobstructing stone in the lower pole. Smaller nonobstructing stones are noted. The right kidney is overall enlarged. --Left kidney/ureter: Multiple large simple and complex cysts are noted throughout the left kidney. There is no left-sided hydronephrosis. There are few nonobstructing stones. The left kidney is overall enlarged. --Urinary bladder: Unremarkable. Stomach/Bowel: --Stomach/Duodenum: No hiatal hernia or other gastric abnormality. Normal duodenal course and caliber. --Small bowel: Unremarkable. --Colon: Rectosigmoid diverticulosis without acute inflammation. --Appendix: Normal. Vascular/Lymphatic: Normal course and caliber of the major abdominal vessels. --there are mildly enlarged retroperitoneal lymph nodes, likely reactive. --No mesenteric lymphadenopathy. --No pelvic or inguinal lymphadenopathy. Reproductive: There are complex appearing bilateral ovarian masses. On the right the mass measures approximately 2.2 cm. On the left it measures 2.4 cm. Other: No ascites or free air. The abdominal wall is normal. Musculoskeletal. No acute displaced fractures. IMPRESSION: 1. Bilateral nonobstructing nephrolithiasis. 2. Findings are consistent with polycystic kidney disease. There are indeterminate bilateral renal nodules, all favored to represent proteinaceous or hemorrhagic cysts. Consider further evaluation with an outpatient renal ultrasound or MRI. 3. Hepatic steatosis. 4. Rectosigmoid diverticulosis without acute inflammation. 5. Mildly enlarged retroperitoneal lymph nodes, likely  reactive. 6. Complex appearing bilateral ovarian masses. These are favored to represent hemorrhagic cysts. Follow-up with an outpatient pelvic ultrasound in 6-12 weeks is recommended for further evaluation of both lesions. 7. Trace bilateral pleural effusions. Electronically Signed   By: Constance Holster M.D.   On: 06/09/2020 21:42    Scheduled Meds: . enoxaparin (LOVENOX) injection  40 mg Subcutaneous Q24H  .  fentaNYL      . metoprolol succinate  25 mg Oral Daily  . norethindrone  2.5 mg Oral Daily   Continuous Infusions: . cefTRIAXone (ROCEPHIN)  IV Stopped (06/10/20 0233)  . lactated ringers 100 mL/hr at 06/10/20 0916     LOS: 1 day   Time spent: 40 minutes.  Lorella Nimrod, MD [facility]  If 7PM-7AM, please contact night-coverage [email or web address]  06/10/2020, 12:08 PM   This record has been created using Systems analyst. Errors have been sought and corrected,but may not always be located. Such creation errors do not reflect on the standard of care.

## 2020-06-10 NOTE — ED Notes (Signed)
Lab called to add on Urine cx to already collected specimen. Lab will also come to collect blood cx's

## 2020-06-10 NOTE — Progress Notes (Signed)
CODE SEPSIS - PHARMACY COMMUNICATION  **Broad Spectrum Antibiotics should be administered within 1 hour of Sepsis diagnosis**  Time Code Sepsis Called/Page Received: 2344  Antibiotics Ordered: Rocephin  Time of 1st antibiotic administration: 0014  Additional action taken by pharmacy: n/a  If necessary, Name of Provider/Nurse Contacted: n/a    Wayland Denis ,PharmD Clinical Pharmacist  06/10/2020  12:56 AM

## 2020-06-11 DIAGNOSIS — N12 Tubulo-interstitial nephritis, not specified as acute or chronic: Secondary | ICD-10-CM | POA: Diagnosis not present

## 2020-06-11 DIAGNOSIS — A4150 Gram-negative sepsis, unspecified: Secondary | ICD-10-CM | POA: Diagnosis not present

## 2020-06-11 DIAGNOSIS — N390 Urinary tract infection, site not specified: Secondary | ICD-10-CM | POA: Diagnosis not present

## 2020-06-11 LAB — CBC
HCT: 37.7 % (ref 36.0–46.0)
Hemoglobin: 12.4 g/dL (ref 12.0–15.0)
MCH: 29 pg (ref 26.0–34.0)
MCHC: 32.9 g/dL (ref 30.0–36.0)
MCV: 88.1 fL (ref 80.0–100.0)
Platelets: 166 10*3/uL (ref 150–400)
RBC: 4.28 MIL/uL (ref 3.87–5.11)
RDW: 14.4 % (ref 11.5–15.5)
WBC: 9.3 10*3/uL (ref 4.0–10.5)
nRBC: 0 % (ref 0.0–0.2)

## 2020-06-11 LAB — URINE CULTURE
Culture: <10000 — AB
Special Requests: NORMAL

## 2020-06-11 MED ORDER — CEFDINIR 300 MG PO CAPS: 300.0000 mg | ORAL_CAPSULE | Freq: Two times a day (BID) | ORAL | 0 refills | Status: AC

## 2020-06-11 MED ORDER — OXYCODONE-ACETAMINOPHEN 7.5-325 MG PO TABS: 1.0000 | ORAL_TABLET | ORAL | 0 refills | Status: DC | PRN

## 2020-06-11 NOTE — Progress Notes (Signed)
   06/11/20 0522  Assess: MEWS Score  Temp (!) 101 F (38.3 C)  BP 112/73  Pulse Rate (!) 109  Resp 16  SpO2 99 %  Assess: MEWS Score  MEWS Temp 1  MEWS Systolic 0  MEWS Pulse 1  MEWS RR 0  MEWS LOC 0  MEWS Score 2  MEWS Score Color Yellow  Assess: if the MEWS score is Yellow or Red  Were vital signs taken at a resting state? Yes  Focused Assessment Change from prior assessment (see assessment flowsheet)  Early Detection of Sepsis Score *See Row Information* Low  MEWS guidelines implemented *See Row Information* Yes  Treat  MEWS Interventions Administered scheduled meds/treatments  Pain Scale 0-10  Pain Score 0  Take Vital Signs  Increase Vital Sign Frequency  Yellow: Q 2hr X 2 then Q 4hr X 2, if remains yellow, continue Q 4hrs  Escalate  MEWS: Escalate Yellow: discuss with charge nurse/RN and consider discussing with provider and RRT  Notify: Charge Nurse/RN  Name of Charge Nurse/RN Notified Phyllis,RN  Date Charge Nurse/RN Notified 06/11/20  Time Charge Nurse/RN Notified 0534  Document  Patient Outcome Other (Comment) (PRN medication given/will reasses)  Progress note created (see row info) Yes

## 2020-06-11 NOTE — Discharge Instructions (Signed)
You also need to follow-up with gynecologist as we discussed regarding your cyst on both of your ovaries and will need a repeat designated ultrasound in 6 to 8 weeks

## 2020-06-11 NOTE — Discharge Summary (Signed)
Physician Discharge Summary  Alice Harrell DJS:[id number] DOB: [birth year] DOA: 06/09/2020  PCP: Patient, No Pcp Per  Admit date: 06/09/2020 Discharge date: 06/11/2020  Admitted From: Home Disposition:  Home  Recommendations for Outpatient Follow-up:  1. Follow up with PCP in 1-2 weeks 2. Follow-up with urology 3. Follow-up with nephrology 4. Please obtain BMP/CBC in one week 5. Please follow up on the following pending results:None  Home Health: No Equipment/Devices: None Discharge Condition: Stable CODE STATUS: Full Diet recommendation: Heart Healthy   Brief/Interim Summary: [name] a34 y.o.Caucasian femalewith a known history of hypertension and polycystic kidney disease as well as thegestational diabetes, who presented to emergency room with acute onset of dysuria that started on 12/23 for which the patient to remaining 2 pills of antibiotics that were given earlier this year and felt slightly better however later on over the last few days she has been starting to develop worsening lower abdominal pressure as well as bilateral flank pain. She admitted to associated nausea and vomiting. She developed fever and chills today. She denied any significant urinary frequency urgency. No chest pain or dyspnea or cough. She was febrile at 100.2, with leukocytosis, tachycardia and elevated creatinine (unknown baseline).  CT renal stone studies with bilateral nonobstructing nephrolithiasis and polycystic kidney disease. Incidental finding of bilateral ovarian hemorrhagic cyst which will need an outpatient evaluation in 6 to 12 weeks by a gynecologist.  Patient initially met sepsis criteria with being febrile, leukocytosis and tachycardia, secondary to UTI and pyelonephritis.  Blood cultures remain negative. Urine cultures with insignificant growth but the urine was sent after she received antibiotics. She was treated with ceftriaxone while in the hospital and discharged  on cefdinir to complete a 1 week course. Discussed with nephrology and urology and she will follow up with both of them as an outpatient.  Patient will follow up with gynecology as an outpatient for bilateral hemorrhagic ovarian cysts and will need a designated pelvic ultrasound in 6 to 12 weeks.   Discharge Diagnoses:  Active Problems:   Sepsis due to gram-negative UTI [facility])   Pyelonephritis   Discharge Instructions  Discharge Instructions    Diet - low sodium heart healthy   Complete by: As directed    Discharge instructions   Complete by: As directed    It was pleasure taking care of you. Continue taking antibiotics for 5 more days. Follow-up with nephrology and urology as an outpatient. Keep yourself well-hydrated and avoid constipation while taking pain medication.   Increase activity slowly   Complete by: As directed      Allergies as of 06/11/2020      Reactions   Amlodipine Other (See Comments)   "Made blood counts off"   Morphine And Related Itching      Medication List    TAKE these medications   Acetaminophen 500 MG capsule Take 1,000 mg by mouth every 8 (eight) hours as needed for fever or pain.   cefdinir 300 MG capsule Commonly known as: OMNICEF Take 1 capsule (300 mg total) by mouth 2 (two) times daily for 5 days.   metoprolol succinate 25 MG 24 hr tablet Commonly known as: TOPROL-XL Take 1 tablet (25 mg total) by mouth daily.   norethindrone 5 MG tablet Commonly known as: AYGESTIN Take 0.5 tablets (2.5 mg total) by mouth daily.   oxyCODONE-acetaminophen 7.5-325 MG tablet Commonly known as: PERCOCET Take 1 tablet by mouth every 4 (four) hours as needed for moderate pain or severe pain.  Follow-up Information    Anthonette Legato, MD. Schedule an appointment as soon as possible for a visit.   Specialty: Nephrology Contact information: [facility] [id number] [phone]        Abbie Sons, MD. Schedule an  appointment as soon as possible for a visit.   Specialty: Urology Contact information: [place] [address] [phone]              Allergies  Allergen Reactions  . Amlodipine Other (See Comments)    "Made blood counts off"   . Morphine And Related Itching    Consultations:  Curbside urology.  Nephrology  Procedures/Studies: CT Renal Stone Study  Result Date: 06/09/2020 CLINICAL DATA:  Flank pain. Kidney stones suspected. Lower abdominal pressure with pain. EXAM: CT ABDOMEN AND PELVIS WITHOUT CONTRAST TECHNIQUE: Multidetector CT imaging of the abdomen and pelvis was performed following the standard protocol without IV contrast. COMPARISON:  CT dated February 22, 2007 FINDINGS: Lower chest: There are trace bilateral pleural effusions.The heart size is normal. Hepatobiliary: There is decreased hepatic attenuation suggestive of hepatic steatosis. Normal gallbladder.There is no biliary ductal dilation. Pancreas: Normal contours without ductal dilatation. No peripancreatic fluid collection. Spleen: Unremarkable. Adrenals/Urinary Tract: --Adrenal glands: Unremarkable. --Right kidney/ureter: Innumerable large cysts are noted throughout the right kidney, some of which are hyperdense. There is a large nonobstructing stone in the lower pole. Smaller nonobstructing stones are noted. The right kidney is overall enlarged. --Left kidney/ureter: Multiple large simple and complex cysts are noted throughout the left kidney. There is no left-sided hydronephrosis. There are few nonobstructing stones. The left kidney is overall enlarged. --Urinary bladder: Unremarkable. Stomach/Bowel: --Stomach/Duodenum: No hiatal hernia or other gastric abnormality. Normal duodenal course and caliber. --Small bowel: Unremarkable. --Colon: Rectosigmoid diverticulosis without acute inflammation. --Appendix: Normal. Vascular/Lymphatic: Normal course and caliber of the major abdominal  vessels. --there are mildly enlarged retroperitoneal lymph nodes, likely reactive. --No mesenteric lymphadenopathy. --No pelvic or inguinal lymphadenopathy. Reproductive: There are complex appearing bilateral ovarian masses. On the right the mass measures approximately 2.2 cm. On the left it measures 2.4 cm. Other: No ascites or free air. The abdominal wall is normal. Musculoskeletal. No acute displaced fractures. IMPRESSION: 1. Bilateral nonobstructing nephrolithiasis. 2. Findings are consistent with polycystic kidney disease. There are indeterminate bilateral renal nodules, all favored to represent proteinaceous or hemorrhagic cysts. Consider further evaluation with an outpatient renal ultrasound or MRI. 3. Hepatic steatosis. 4. Rectosigmoid diverticulosis without acute inflammation. 5. Mildly enlarged retroperitoneal lymph nodes, likely reactive. 6. Complex appearing bilateral ovarian masses. These are favored to represent hemorrhagic cysts. Follow-up with an outpatient pelvic ultrasound in 6-12 weeks is recommended for further evaluation of both lesions. 7. Trace bilateral pleural effusions. Electronically Signed   By: Constance Holster M.D.   On: 06/09/2020 21:42     Subjective: Patient was feeling better when seen today. Husband at bedside. She wants to go home. Continues to have some flank pain, right little worse than left but stating that Percocet is helping. Discussed follow-up with nephrology, urology and gynecology.  Discharge Exam: Vitals:   06/11/20 1005 06/11/20 1030  BP: 110/80 123/77  Pulse: 96 97  Resp:    Temp:  98.1 F (36.7 C)  SpO2:  100%   Vitals:   06/11/20 0630 06/11/20 0836 06/11/20 1005 06/11/20 1030  BP: 118/73 119/80 110/80 123/77  Pulse: (!) 118 93 96 97  Resp:  16    Temp: 98.6 F (37  C) 98.2 F (36.8 C)  98.1 F (36.7 C)  TempSrc:  Oral  Oral  SpO2: 98% 95%  100%  Weight:      Height:        General: Pt is alert, awake, not in acute  distress Cardiovascular: RRR, S1/S2 +, no rubs, no gallops Respiratory: CTA bilaterally, no wheezing, no rhonchi Abdominal: Soft, NT, ND, bowel sounds + Extremities: no edema, no cyanosis   The results of significant diagnostics from this hospitalization (including imaging, microbiology, ancillary and laboratory) are listed below for reference.    Microbiology: Recent Results (from the past 240 hour(s))  Urine Culture     Status: Abnormal   Collection Time: 06/09/20  8:52 PM   Specimen: Urine, Random  Result Value Ref Range Status   Specimen Description   Final    URINE, RANDOM Performed at [facility], [address] [place], [place] [address]    Special Requests   Final    Normal Performed at [facility], [address] [place], [place] [address]    Culture (A)  Final    <10,000 COLONIES/mL INSIGNIFICANT GROWTH Performed at [facility], [place] [address] [place], [place] [address]    Report Status 06/11/2020 FINAL  Final  Resp Panel by RT-PCR (Flu A&B, Covid) Nasopharyngeal Swab     Status: None   Collection Time: 06/10/20 10:00 AM   Specimen: Nasopharyngeal Swab; Nasopharyngeal(NP) swabs in vial transport medium  Result Value Ref Range Status   SARS Coronavirus 2 by RT PCR NEGATIVE NEGATIVE Final    Comment: (NOTE) SARS-CoV-2 target nucleic acids are NOT DETECTED.  The SARS-CoV-2 RNA is generally detectable in upper respiratory specimens during the acute phase of infection. The lowest concentration of SARS-CoV-2 viral copies this assay can detect is 138 copies/mL. A negative result does not preclude SARS-Cov-2 infection and should not be used as the sole basis for treatment or other patient management decisions. A negative result may occur with  improper specimen collection/handling, submission of specimen other than nasopharyngeal swab, presence of viral mutation(s) within the areas targeted by this assay, and inadequate number of  viral copies(<138 copies/mL). A negative result must be combined with clinical observations, patient history, and epidemiological information. The expected result is Negative.  Fact Sheet for Patients:  EntrepreneurPulse.com.au  Fact Sheet for Healthcare Providers:  IncredibleEmployment.be  This test is no t yet approved or cleared by the [place] FDA and  has been authorized for detection and/or diagnosis of SARS-CoV-2 by FDA under an Emergency Use Authorization (EUA). This EUA will remain  in effect (meaning this test can be used) for the duration of the COVID-19 declaration under Section 564(b)(1) of the Act, 21 U.S.C.section 360bbb-3(b)(1), unless the authorization is terminated  or revoked sooner.       Influenza A by PCR NEGATIVE NEGATIVE Final   Influenza B by PCR NEGATIVE NEGATIVE Final    Comment: (NOTE) The Xpert Xpress SARS-CoV-2/FLU/RSV plus assay is intended as an aid in the diagnosis of influenza from Nasopharyngeal swab specimens and should not be used as a sole basis for treatment. Nasal washings and aspirates are unacceptable for Xpert Xpress SARS-CoV-2/FLU/RSV testing.  Fact Sheet for Patients: EntrepreneurPulse.com.au  Fact Sheet for Healthcare Providers: IncredibleEmployment.be  This test is not yet approved or cleared by the [place] FDA and has been authorized for detection and/or diagnosis of SARS-CoV-2 by FDA under an Emergency Use Authorization (EUA). This EUA will remain in effect (meaning this  test can be used) for the duration of the COVID-19 declaration under Section 564(b)(1) of the Act, 21 U.S.C. section 360bbb-3(b)(1), unless the authorization is terminated or revoked.  Performed at [facility], [address] [place], [place] [address]   CULTURE, BLOOD (ROUTINE X 2) w Reflex to ID Panel     Status: None (Preliminary result)   Collection Time:  06/10/20  2:02 PM   Specimen: BLOOD  Result Value Ref Range Status   Specimen Description BLOOD LEFT ANTECUBITAL  Final   Special Requests   Final    BOTTLES DRAWN AEROBIC AND ANAEROBIC Blood Culture adequate volume   Culture   Final    NO GROWTH < 24 HOURS Performed at [facility], [address], [place], [place] [address]    Report Status PENDING  Incomplete  CULTURE, BLOOD (ROUTINE X 2) w Reflex to ID Panel     Status: None (Preliminary result)   Collection Time: 06/10/20  2:12 PM   Specimen: BLOOD  Result Value Ref Range Status   Specimen Description BLOOD BLOOD LEFT HAND  Final   Special Requests   Final    AEROBIC BOTTLE ONLY Blood Culture results may not be optimal due to an inadequate volume of blood received in culture bottles   Culture   Final    NO GROWTH < 24 HOURS Performed at [facility], [address] [place], [place] [address]    Report Status PENDING  Incomplete     Labs: BNP (last 3 results) No results for input(s): BNP in the last 8760 hours. Basic Metabolic Panel: Recent Labs  Lab 06/09/20 2052 06/10/20 0437 06/10/20 1402  NA 134* 134* 136  K 3.8 3.9 3.8  CL 102 105 103  CO2 23 23 23   GLUCOSE 141* 132* 109*  BUN 21* 20 27*  CREATININE 1.68* 1.74* 2.11*  CALCIUM 8.9 7.9* 8.3*  PHOS  --   --  3.4   Liver Function Tests: Recent Labs  Lab 06/09/20 2052 06/10/20 1402  AST 14*  --   ALT 11  --   ALKPHOS 69  --   BILITOT 1.1  --   PROT 7.8  --   ALBUMIN 3.9 3.1*   No results for input(s): LIPASE, AMYLASE in the last 168 hours. No results for input(s): AMMONIA in the last 168 hours. CBC: Recent Labs  Lab 06/09/20 2052 06/10/20 0437 06/11/20 0510  WBC 19.3* 17.7* 9.3  HGB 15.2* 13.1 12.4  HCT 47.0* 40.8 37.7  MCV 87.0 88.7 88.1  PLT 235 184 166   Cardiac Enzymes: No results for input(s): CKTOTAL, CKMB, CKMBINDEX, TROPONINI in the last 168 hours. BNP: Invalid input(s): POCBNP CBG: No results for input(s):  GLUCAP in the last 168 hours. D-Dimer No results for input(s): DDIMER in the last 72 hours. Hgb A1c No results for input(s): HGBA1C in the last 72 hours. Lipid Profile No results for input(s): CHOL, HDL, LDLCALC, TRIG, CHOLHDL, LDLDIRECT in the last 72 hours. Thyroid function studies No results for input(s): TSH, T4TOTAL, T3FREE, THYROIDAB in the last 72 hours.  Invalid input(s): FREET3 Anemia work up No results for input(s): VITAMINB12, FOLATE, FERRITIN, TIBC, IRON, RETICCTPCT in the last 72 hours. Urinalysis    Component Value Date/Time   COLORURINE YELLOW (A) 06/09/2020 2052   APPEARANCEUR CLOUDY (A) 06/09/2020 2052   LABSPEC 1.014 06/09/2020 2052   PHURINE 5.0 06/09/2020 2052   GLUCOSEU NEGATIVE 06/09/2020 2052   HGBUR LARGE (A) 06/09/2020 2052  [place] NEGATIVE 06/09/2020 2052   KETONESUR NEGATIVE 06/09/2020 2052   PROTEINUR >=300 (A) 06/09/2020 2052   NITRITE NEGATIVE 06/09/2020 2052   LEUKOCYTESUR LARGE (A) 06/09/2020 2052   Sepsis Labs Invalid input(s): PROCALCITONIN,  WBC,  LACTICIDVEN Microbiology Recent Results (from the past 240 hour(s))  Urine Culture     Status: Abnormal   Collection Time: 06/09/20  8:52 PM   Specimen: Urine, Random  Result Value Ref Range Status   Specimen Description   Final    URINE, RANDOM Performed at [facility], [address] [place], [place] [address]    Special Requests   Final    Normal Performed at [facility], [address] [place], [place] [address]    Culture (A)  Final    <10,000 COLONIES/mL INSIGNIFICANT GROWTH Performed at [facility], [place] [address] [place], [place] [address]    Report Status 06/11/2020 FINAL  Final  Resp Panel by RT-PCR (Flu A&B, Covid) Nasopharyngeal Swab     Status: None   Collection Time: 06/10/20 10:00 AM   Specimen: Nasopharyngeal Swab; Nasopharyngeal(NP) swabs in vial transport medium  Result Value Ref Range Status   SARS Coronavirus 2 by RT PCR NEGATIVE  NEGATIVE Final    Comment: (NOTE) SARS-CoV-2 target nucleic acids are NOT DETECTED.  The SARS-CoV-2 RNA is generally detectable in upper respiratory specimens during the acute phase of infection. The lowest concentration of SARS-CoV-2 viral copies this assay can detect is 138 copies/mL. A negative result does not preclude SARS-Cov-2 infection and should not be used as the sole basis for treatment or other patient management decisions. A negative result may occur with  improper specimen collection/handling, submission of specimen other than nasopharyngeal swab, presence of viral mutation(s) within the areas targeted by this assay, and inadequate number of viral copies(<138 copies/mL). A negative result must be combined with clinical observations, patient history, and epidemiological information. The expected result is Negative.  Fact Sheet for Patients:  EntrepreneurPulse.com.au  Fact Sheet for Healthcare Providers:  IncredibleEmployment.be  This test is no t yet approved or cleared by the [place] FDA and  has been authorized for detection and/or diagnosis of SARS-CoV-2 by FDA under an Emergency Use Authorization (EUA). This EUA will remain  in effect (meaning this test can be used) for the duration of the COVID-19 declaration under Section 564(b)(1) of the Act, 21 U.S.C.section 360bbb-3(b)(1), unless the authorization is terminated  or revoked sooner.       Influenza A by PCR NEGATIVE NEGATIVE Final   Influenza B by PCR NEGATIVE NEGATIVE Final    Comment: (NOTE) The Xpert Xpress SARS-CoV-2/FLU/RSV plus assay is intended as an aid in the diagnosis of influenza from Nasopharyngeal swab specimens and should not be used as a sole basis for treatment. Nasal washings and aspirates are unacceptable for Xpert Xpress SARS-CoV-2/FLU/RSV testing.  Fact Sheet for Patients: EntrepreneurPulse.com.au  Fact Sheet for Healthcare  Providers: IncredibleEmployment.be  This test is not yet approved or cleared by the [place] FDA and has been authorized for detection and/or diagnosis of SARS-CoV-2 by FDA under an Emergency Use Authorization (EUA). This EUA will remain in effect (meaning this test can be used) for the duration of the COVID-19 declaration under Section 564(b)(1) of the Act, 21 U.S.C. section 360bbb-3(b)(1), unless the authorization is terminated or revoked.  Performed at [facility], [place], [place] [address]   CULTURE, BLOOD (ROUTINE X 2) w Reflex to ID Panel  Status: None (Preliminary result)   Collection Time: 06/10/20  2:02 PM   Specimen: BLOOD  Result Value Ref Range Status   Specimen Description BLOOD LEFT ANTECUBITAL  Final   Special Requests   Final    BOTTLES DRAWN AEROBIC AND ANAEROBIC Blood Culture adequate volume   Culture   Final    NO GROWTH < 24 HOURS Performed at [facility], [address] [place], [place] [address]    Report Status PENDING  Incomplete  CULTURE, BLOOD (ROUTINE X 2) w Reflex to ID Panel     Status: None (Preliminary result)   Collection Time: 06/10/20  2:12 PM   Specimen: BLOOD  Result Value Ref Range Status   Specimen Description BLOOD BLOOD LEFT HAND  Final   Special Requests   Final    AEROBIC BOTTLE ONLY Blood Culture results may not be optimal due to an inadequate volume of blood received in culture bottles   Culture   Final    NO GROWTH < 24 HOURS Performed at [facility], [address] [place], [place] [address]    Report Status PENDING  Incomplete    Time coordinating discharge: Over 30 minutes  SIGNED:  Lorella Nimrod, MD  [facility] 06/11/2020, 11:32 AM  If 7PM-7AM, please contact night-coverage [email or web address]  This record has been created using Systems analyst. Errors have been sought and corrected,but may not always be located. Such  creation errors do not reflect on the standard of care.

## 2020-06-12 ENCOUNTER — Telehealth: Payer: Self-pay | Admitting: Obstetrics and Gynecology

## 2020-06-12 ENCOUNTER — Other Ambulatory Visit: Payer: Self-pay | Admitting: Obstetrics and Gynecology

## 2020-06-12 ENCOUNTER — Ambulatory Visit: Payer: BC Managed Care – PPO | Admitting: Obstetrics and Gynecology

## 2020-06-12 DIAGNOSIS — N83202 Unspecified ovarian cyst, left side: Secondary | ICD-10-CM

## 2020-06-12 MED ORDER — NORETHINDRONE ACETATE 5 MG PO TABS: 2.5000 mg | ORAL_TABLET | Freq: Every day | ORAL | 3 refills | Status: DC

## 2020-06-12 MED ORDER — METOPROLOL SUCCINATE ER 25 MG PO TB24: 25.0000 mg | ORAL_TABLET | Freq: Every day | ORAL | 3 refills | Status: DC

## 2020-06-12 NOTE — Telephone Encounter (Signed)
Patient has been scheduled for annual with Gyn u/s for 6 weeks out Per AMS. Patient is also requesting refill on Blood pressure medication to get to her scheduled appointment.

## 2020-06-12 NOTE — Telephone Encounter (Signed)
Patient was scheduled for today for annual. Patient was inpatient at the hospital on 06/09/20 and just discharged yesterday. Patient states she still doesn't feel well enough to come in today. Patient would like to know what she needs to be scheduled for was advised to have Ultrasound on ovaries. Patient states she gave her birthcontrol pills to the hospital and didn't not have them returned to her. She would like a refill sent in. Patient requesting a call from AMS. Please advise

## 2020-06-15 LAB — CULTURE, BLOOD (ROUTINE X 2)
Culture: NO GROWTH
Culture: NO GROWTH
Special Requests: ADEQUATE

## 2020-06-24 ENCOUNTER — Other Ambulatory Visit: Payer: Self-pay | Admitting: Obstetrics and Gynecology

## 2020-06-24 ENCOUNTER — Telehealth: Payer: Self-pay

## 2020-06-24 ENCOUNTER — Other Ambulatory Visit: Payer: Self-pay

## 2020-06-24 ENCOUNTER — Ambulatory Visit (INDEPENDENT_AMBULATORY_CARE_PROVIDER_SITE_OTHER): Payer: Self-pay

## 2020-06-24 DIAGNOSIS — R300 Dysuria: Secondary | ICD-10-CM

## 2020-06-24 LAB — POCT URINALYSIS DIPSTICK: Protein, UA: POSITIVE — AB

## 2020-06-24 NOTE — Telephone Encounter (Signed)
Patient doesn't believe her UTI has gone away. Her urine is still very cloudy. Inquiring if she can get another rx or if she needs to come in for a urinalysis. AQ#[phone]

## 2020-06-24 NOTE — Telephone Encounter (Signed)
Spoke w/patient. She isn't having any dysuria, no odor to urine. It is fairly light in color, but still very cloudy. She is still feeling very fatigued.

## 2020-06-24 NOTE — Telephone Encounter (Signed)
Patient aware. Scheduled for Urine drop off for culture at 4pm today.

## 2020-06-24 NOTE — Telephone Encounter (Signed)
Future order is in for urine culture but we need to do a culture if she didn't improve after initial course

## 2020-06-26 ENCOUNTER — Other Ambulatory Visit: Payer: Self-pay | Admitting: Nephrology

## 2020-06-26 DIAGNOSIS — N179 Acute kidney failure, unspecified: Secondary | ICD-10-CM

## 2020-06-26 DIAGNOSIS — N200 Calculus of kidney: Secondary | ICD-10-CM

## 2020-06-26 DIAGNOSIS — R809 Proteinuria, unspecified: Secondary | ICD-10-CM

## 2020-06-26 DIAGNOSIS — Q613 Polycystic kidney, unspecified: Secondary | ICD-10-CM

## 2020-06-26 DIAGNOSIS — R8290 Unspecified abnormal findings in urine: Secondary | ICD-10-CM

## 2020-06-27 LAB — URINE CULTURE

## 2020-07-04 ENCOUNTER — Other Ambulatory Visit: Payer: Self-pay

## 2020-07-04 ENCOUNTER — Ambulatory Visit
Admission: EM | Admit: 2020-07-04 | Discharge: 2020-07-04 | Disposition: A | Discharge status: Home / Self Care | Payer: BC Managed Care – PPO | Attending: Family Medicine | Admitting: Family Medicine

## 2020-07-04 DIAGNOSIS — N3001 Acute cystitis with hematuria: Secondary | ICD-10-CM | POA: Insufficient documentation

## 2020-07-04 LAB — POCT URINALYSIS DIP (MANUAL ENTRY)
Bilirubin, UA: NEGATIVE
Glucose, UA: NEGATIVE mg/dL
Ketones, POC UA: NEGATIVE mg/dL
Nitrite, UA: NEGATIVE
Protein Ur, POC: 100 mg/dL — AB
Spec Grav, UA: 1.02 (ref 1.010–1.025)
Urobilinogen, UA: 0.2 E.U./dL
pH, UA: 6 (ref 5.0–8.0)

## 2020-07-04 MED ORDER — SULFAMETHOXAZOLE-TRIMETHOPRIM 800-160 MG PO TABS: 1.0000 | ORAL_TABLET | Freq: Two times a day (BID) | ORAL | 0 refills | Status: AC

## 2020-07-04 MED ORDER — CEFTRIAXONE SODIUM 1 G IJ SOLR
1.0000 g | Freq: Once | INTRAMUSCULAR | Status: AC
  Administered 2020-07-04: 1 g via INTRAMUSCULAR

## 2020-07-04 NOTE — ED Triage Notes (Signed)
Pt states long h/o polycystic kidney dz with only side effect being HTN until this past Christmas when she developed urosepsis.  Reports she awoke this morning with blood in urine. Also reports low back pain onset last night dull, nagging in nature.   Denies burning with urination, frequency, urgency, n/v/d, fever.   Pt spoke with renal doctor this morning who advised pt to have urinalysis stat, pt has renal u/s scheduled for this morning.

## 2020-07-04 NOTE — Discharge Instructions (Addendum)
Treating you for a urinary tract infection, possible early kidney infection Injection of antibiotics given here Bactrim sent to the pharmacy to take 2 x day for 7 days.  If worse go to the ER.  Follow up as needed for continued or worsening symptoms

## 2020-07-04 NOTE — ED Provider Notes (Signed)
Renaldo Fiddler    CSN: [id number] Arrival date & time: 07/04/20  1142      History   Chief Complaint No chief complaint on file.   HPI Alice Harrell is a 35 y.o. female.   Patient is a 35 year old female with past medical history of polycystic kidney disease, hypertension, pyelonephritis, urosepsis.  She presents today with complaints of hematuria.  She is also has some lower mid back pain that started last night that was dull and aching.  Denies any burning with urination, frequency or urgency.  No fevers.  Was sent here by urology and advised to have urinalysis.     Past Medical History:  Diagnosis Date  . Gestational diabetes   . Hypertension   . Polycystic kidney disease     Patient Active Problem List   Diagnosis Date Noted  . Pyelonephritis   . Sepsis due to gram-negative UTI (HCC) 06/09/2020  . Gestational hypertension 03/04/2016  . Gestational diabetes 03/04/2016  . History of cesarean delivery 03/04/2016  . Obesity affecting pregnancy in third trimester 03/04/2016  . Preeclampsia 03/04/2016    Past Surgical History:  Procedure Laterality Date  . CESAREAN SECTION N/A 03/04/2016   Procedure: CESAREAN SECTION;  Surgeon: Nadara Mustard, MD;  Location: [facility] ORS;  Service: Obstetrics;  Laterality: N/A;  . CESAREAN SECTION      OB History    Gravida  2   Para  2   Term  2   Preterm      AB      Living  2     SAB      IAB      Ectopic      Multiple  0   Live Births  2            Home Medications    Prior to Admission medications   Medication Sig Start Date End Date Taking? Authorizing Provider  Acetaminophen 500 MG capsule Take 1,000 mg by mouth every 8 (eight) hours as needed for fever or pain.   Yes [provider]  losartan (COZAAR) 25 MG tablet Take 25 mg by mouth daily. 06/26/20  Yes [provider]  metoprolol succinate (TOPROL-XL) 25 MG 24 hr tablet Take 1 tablet (25 mg total) by mouth daily.  06/12/20  Yes Vena Austria, MD  sulfamethoxazole-trimethoprim (BACTRIM DS) 800-160 MG tablet Take 1 tablet by mouth 2 (two) times daily for 7 days. 07/04/20 07/11/20 Yes Janari Yamada A, NP  norethindrone (AYGESTIN) 5 MG tablet Take 0.5 tablets (2.5 mg total) by mouth daily. 06/12/20   Vena Austria, MD  oxyCODONE-acetaminophen (PERCOCET) 7.5-325 MG tablet Take 1 tablet by mouth every 4 (four) hours as needed for moderate pain or severe pain. 06/11/20   Arnetha Courser, MD    Family History History reviewed. No pertinent family history.  Social History Social History   Tobacco Use  . Smoking status: Never Smoker  . Smokeless tobacco: Never Used  Vaping Use  . Vaping Use: Never used  Substance Use Topics  . Alcohol use: No  . Drug use: No     Allergies   Amlodipine and Morphine and related   Review of Systems Review of Systems   Physical Exam Triage Vital Signs ED Triage Vitals  Enc Vitals Group     BP 07/04/20 1227 (!) 134/96     Pulse Rate 07/04/20 1227 100     Resp 07/04/20 1227 18     Temp 07/04/20 1227 99.1  F (37.3 C)     Temp Source 07/04/20 1227 Oral     SpO2 07/04/20 1227 97 %     Weight --      Height --      Head Circumference --      Peak Flow --      Pain Score 07/04/20 1223 2     Pain Loc --      Pain Edu? --      Excl. in GC? --    No data found.  Updated Vital Signs BP (!) 134/96 (BP Location: Left Arm)   Pulse 100   Temp 99.1 F (37.3 C) (Oral)   Resp 18   SpO2 97%   Visual Acuity Right Eye Distance:   Left Eye Distance:   Bilateral Distance:    Right Eye Near:   Left Eye Near:    Bilateral Near:     Physical Exam Vitals and nursing note reviewed.  Constitutional:      General: She is not in acute distress.    Appearance: Normal appearance. She is not ill-appearing, toxic-appearing or diaphoretic.  HENT:     Head: Normocephalic.  Eyes:     Conjunctiva/sclera: Conjunctivae normal.  Pulmonary:     Effort: Pulmonary  effort is normal.  Musculoskeletal:        General: Normal range of motion.     Cervical back: Normal range of motion.     Lumbar back: Tenderness present.  Skin:    General: Skin is warm and dry.     Findings: No rash.  Neurological:     Mental Status: She is alert.  Psychiatric:        Mood and Affect: Mood normal.      UC Treatments / Results  Labs (all labs ordered are listed, but only abnormal results are displayed) Labs Reviewed  POCT URINALYSIS DIP (MANUAL ENTRY) - Abnormal; Notable for the following components:      Result Value   Color, UA brown (*)    Clarity, UA cloudy (*)    Blood, UA large (*)    Protein Ur, POC =100 (*)    Leukocytes, UA Large (3+) (*)    All other components within normal limits  URINE CULTURE    EKG   Radiology No results found.  Procedures Procedures (including critical care time)  Medications Ordered in UC Medications  cefTRIAXone (ROCEPHIN) injection 1 g (1 g Intramuscular Given 07/04/20 1300)    Initial Impression / Assessment and Plan / UC Course  I have reviewed the triage vital signs and the nursing notes.  Pertinent labs & imaging results that were available during my care of the patient were reviewed by me and considered in my medical decision making (see chart for details).     Acute cystitis with hematuria and concern for possible early pyelonephritis Urine with large leuks, large blood, cloudy, protein.  Sending for culture. Based on urinalysis, past medical history we will go ahead and treat for possible early pyelonephritis Treating with antibiotic injection given here in clinic today, Rocephin 1 g Sending Bactrim to the pharmacy to take twice a day for a week Recommended ER for any worsening problems Follow up as needed for continued or worsening symptoms  Final Clinical Impressions(s) / UC Diagnoses   Final diagnoses:  Acute cystitis with hematuria     Discharge Instructions     Treating you for a  urinary tract infection, possible early kidney infection Injection of antibiotics given  here Bactrim sent to the pharmacy to take 2 x day for 7 days.  If worse go to the ER.  Follow up as needed for continued or worsening symptoms     ED Prescriptions    Medication Sig Dispense Auth. Provider   sulfamethoxazole-trimethoprim (BACTRIM DS) 800-160 MG tablet Take 1 tablet by mouth 2 (two) times daily for 7 days. 14 tablet Kahlel Peake A, NP     PDMP not reviewed this encounter.   Janace Aris, NP 07/04/20 [phone]

## 2020-07-05 ENCOUNTER — Ambulatory Visit
Admission: RE | Admit: 2020-07-05 | Discharge: 2020-07-05 | Disposition: A | Discharge status: Home / Self Care | Payer: BC Managed Care – PPO | Source: Ambulatory Visit | Attending: Nephrology | Admitting: Nephrology

## 2020-07-05 DIAGNOSIS — N179 Acute kidney failure, unspecified: Secondary | ICD-10-CM | POA: Diagnosis present

## 2020-07-05 DIAGNOSIS — Q613 Polycystic kidney, unspecified: Secondary | ICD-10-CM | POA: Diagnosis present

## 2020-07-05 DIAGNOSIS — R8290 Unspecified abnormal findings in urine: Secondary | ICD-10-CM

## 2020-07-05 DIAGNOSIS — R809 Proteinuria, unspecified: Secondary | ICD-10-CM

## 2020-07-05 DIAGNOSIS — N200 Calculus of kidney: Secondary | ICD-10-CM | POA: Diagnosis present

## 2020-07-06 LAB — URINE CULTURE

## 2020-07-11 ENCOUNTER — Ambulatory Visit
Admission: EM | Admit: 2020-07-11 | Discharge: 2020-07-11 | Disposition: A | Discharge status: Home / Self Care | Payer: BC Managed Care – PPO | Attending: Family Medicine | Admitting: Family Medicine

## 2020-07-11 DIAGNOSIS — R300 Dysuria: Secondary | ICD-10-CM | POA: Diagnosis not present

## 2020-07-11 LAB — POCT URINALYSIS DIP (MANUAL ENTRY)
Bilirubin, UA: NEGATIVE
Glucose, UA: NEGATIVE mg/dL
Ketones, POC UA: NEGATIVE mg/dL
Nitrite, UA: NEGATIVE
Protein Ur, POC: 100 mg/dL — AB
Spec Grav, UA: 1.02 (ref 1.010–1.025)
Urobilinogen, UA: 0.2 E.U./dL
pH, UA: 5.5 (ref 5.0–8.0)

## 2020-07-11 MED ORDER — CEPHALEXIN 500 MG PO CAPS: 500.0000 mg | ORAL_CAPSULE | Freq: Two times a day (BID) | ORAL | 0 refills | Status: AC

## 2020-07-11 NOTE — ED Triage Notes (Signed)
Pt reports having burning with urination that began this morning. sts she was seen last week for the same and prescribed abx.

## 2020-07-11 NOTE — ED Provider Notes (Signed)
Renaldo Fiddler    CSN: [id number] Arrival date & time: 07/11/20  2585      History   Chief Complaint Chief Complaint  Patient presents with  . Dysuria    HPI Alice Harrell is a 35 y.o. female.   35 year old female presents today with dysuria that started this morning.  She is also has some urinary urgency.  Treated for a urinary tract infection last week.  Culture was inconclusive at that time.  History of polycystic kidney disease, hypertension.  No fevers, chills, abdominal pain, back pain.  No vaginal symptoms.     Past Medical History:  Diagnosis Date  . Gestational diabetes   . Hypertension   . Polycystic kidney disease     Patient Active Problem List   Diagnosis Date Noted  . Pyelonephritis   . Sepsis due to gram-negative UTI (HCC) 06/09/2020  . Gestational hypertension 03/04/2016  . Gestational diabetes 03/04/2016  . History of cesarean delivery 03/04/2016  . Obesity affecting pregnancy in third trimester 03/04/2016  . Preeclampsia 03/04/2016    Past Surgical History:  Procedure Laterality Date  . CESAREAN SECTION N/A 03/04/2016   Procedure: CESAREAN SECTION;  Surgeon: Nadara Mustard, MD;  Location: [facility] ORS;  Service: Obstetrics;  Laterality: N/A;  . CESAREAN SECTION      OB History    Gravida  2   Para  2   Term  2   Preterm      AB      Living  2     SAB      IAB      Ectopic      Multiple  0   Live Births  2            Home Medications    Prior to Admission medications   Medication Sig Start Date End Date Taking? Authorizing Provider  cephALEXin (KEFLEX) 500 MG capsule Take 1 capsule (500 mg total) by mouth 2 (two) times daily for 7 days. 07/11/20 07/18/20 Yes Anneth Brunell A, NP  Acetaminophen 500 MG capsule Take 1,000 mg by mouth every 8 (eight) hours as needed for fever or pain.    [provider]  losartan (COZAAR) 25 MG tablet Take 25 mg by mouth daily. 06/26/20   [provider]  metoprolol  succinate (TOPROL-XL) 25 MG 24 hr tablet Take 1 tablet (25 mg total) by mouth daily. 06/12/20   Vena Austria, MD  norethindrone (AYGESTIN) 5 MG tablet Take 0.5 tablets (2.5 mg total) by mouth daily. 06/12/20   Vena Austria, MD  oxyCODONE-acetaminophen (PERCOCET) 7.5-325 MG tablet Take 1 tablet by mouth every 4 (four) hours as needed for moderate pain or severe pain. 06/11/20   Arnetha Courser, MD  sulfamethoxazole-trimethoprim (BACTRIM DS) 800-160 MG tablet Take 1 tablet by mouth 2 (two) times daily for 7 days. 07/04/20 07/11/20  Janace Aris, NP    Family History History reviewed. No pertinent family history.  Social History Social History   Tobacco Use  . Smoking status: Never Smoker  . Smokeless tobacco: Never Used  Vaping Use  . Vaping Use: Never used  Substance Use Topics  . Alcohol use: No  . Drug use: No     Allergies   Amlodipine and Morphine and related   Review of Systems Review of Systems   Physical Exam Triage Vital Signs ED Triage Vitals [07/11/20 0835]  Enc Vitals Group     BP (!) 148/92     Pulse  Rate 97     Resp 16     Temp 98.1 F (36.7 C)     Temp Source Oral     SpO2 98 %     Weight 211 lb 13.8 oz (96.1 kg)     Height 5\' 5"  (1.651 m)     Head Circumference      Peak Flow      Pain Score 2     Pain Loc      Pain Edu?      Excl. in GC?    No data found.  Updated Vital Signs BP (!) 148/92   Pulse 97   Temp 98.1 F (36.7 C) (Oral)   Resp 16   Ht 5\' 5"  (1.651 m)   Wt 211 lb 13.8 oz (96.1 kg)   SpO2 98%   BMI 35.26 kg/m   Visual Acuity Right Eye Distance:   Left Eye Distance:   Bilateral Distance:    Right Eye Near:   Left Eye Near:    Bilateral Near:     Physical Exam Vitals and nursing note reviewed.  Constitutional:      General: She is not in acute distress.    Appearance: Normal appearance. She is not ill-appearing, toxic-appearing or diaphoretic.  HENT:     Head: Normocephalic.  Eyes:     Conjunctiva/sclera:  Conjunctivae normal.  Pulmonary:     Effort: Pulmonary effort is normal.  Musculoskeletal:        General: Normal range of motion.     Cervical back: Normal range of motion.  Skin:    General: Skin is warm and dry.     Findings: No rash.  Neurological:     Mental Status: She is alert.  Psychiatric:        Mood and Affect: Mood normal.      UC Treatments / Results  Labs (all labs ordered are listed, but only abnormal results are displayed) Labs Reviewed  POCT URINALYSIS DIP (MANUAL ENTRY) - Abnormal; Notable for the following components:      Result Value   Clarity, UA cloudy (*)    Blood, UA moderate (*)    Protein Ur, POC =100 (*)    Leukocytes, UA Large (3+) (*)    All other components within normal limits  URINE CULTURE    EKG   Radiology No results found.  Procedures Procedures (including critical care time)  Medications Ordered in UC Medications - No data to display  Initial Impression / Assessment and Plan / UC Course  I have reviewed the triage vital signs and the nursing notes.  Pertinent labs & imaging results that were available during my care of the patient were reviewed by me and considered in my medical decision making (see chart for details).     Dysuria Urine with large leuks, protein, moderate blood and cloudy.  Sending for culture. We will go ahead and treat with Keflex at this time Follow up as needed for continued or worsening symptoms  Final Clinical Impressions(s) / UC Diagnoses   Final diagnoses:  Dysuria     Discharge Instructions     Take the antibiotics  as prescribed Sending urine for culture.  Push fluids.  Follow up as needed for continued or worsening symptoms     ED Prescriptions    Medication Sig Dispense Auth. Provider   cephALEXin (KEFLEX) 500 MG capsule Take 1 capsule (500 mg total) by mouth 2 (two) times daily for 7 days. 14 capsule  Janace Aris, NP     PDMP not reviewed this encounter.   Janace Aris,  NP 07/11/20 1531

## 2020-07-11 NOTE — Discharge Instructions (Addendum)
Take the antibiotics  as prescribed Sending urine for culture.  Push fluids.  Follow up as needed for continued or worsening symptoms

## 2020-07-13 LAB — URINE CULTURE: Culture: 30000 — AB

## 2020-07-15 ENCOUNTER — Telehealth (HOSPITAL_COMMUNITY): Payer: Self-pay | Admitting: Emergency Medicine

## 2020-07-15 MED ORDER — NITROFURANTOIN MONOHYD MACRO 100 MG PO CAPS: 100.0000 mg | ORAL_CAPSULE | Freq: Two times a day (BID) | ORAL | 0 refills | Status: AC

## 2020-07-26 ENCOUNTER — Ambulatory Visit (INDEPENDENT_AMBULATORY_CARE_PROVIDER_SITE_OTHER): Payer: BC Managed Care – PPO | Admitting: Obstetrics and Gynecology

## 2020-07-26 ENCOUNTER — Encounter: Payer: Self-pay | Admitting: Obstetrics and Gynecology

## 2020-07-26 ENCOUNTER — Ambulatory Visit (INDEPENDENT_AMBULATORY_CARE_PROVIDER_SITE_OTHER): Payer: BC Managed Care – PPO

## 2020-07-26 ENCOUNTER — Other Ambulatory Visit: Payer: Self-pay

## 2020-07-26 VITALS — BP 132/90 | Ht 64.0 in | Wt 203.0 lb

## 2020-07-26 DIAGNOSIS — Z01419 Encounter for gynecological examination (general) (routine) without abnormal findings: Secondary | ICD-10-CM | POA: Diagnosis not present

## 2020-07-26 DIAGNOSIS — N83201 Unspecified ovarian cyst, right side: Secondary | ICD-10-CM | POA: Diagnosis not present

## 2020-07-26 DIAGNOSIS — N83202 Unspecified ovarian cyst, left side: Secondary | ICD-10-CM

## 2020-07-26 DIAGNOSIS — Z1239 Encounter for other screening for malignant neoplasm of breast: Secondary | ICD-10-CM | POA: Diagnosis not present

## 2020-07-26 DIAGNOSIS — Z3041 Encounter for surveillance of contraceptive pills: Secondary | ICD-10-CM

## 2020-07-26 MED ORDER — LOSARTAN POTASSIUM 25 MG PO TABS: 25.0000 mg | ORAL_TABLET | Freq: Every day | ORAL | 3 refills | Status: DC

## 2020-07-26 MED ORDER — METOPROLOL SUCCINATE ER 25 MG PO TB24: 25.0000 mg | ORAL_TABLET | Freq: Every day | ORAL | 3 refills | Status: DC

## 2020-07-26 MED ORDER — NORETHINDRONE ACETATE 5 MG PO TABS: 2.5000 mg | ORAL_TABLET | Freq: Every day | ORAL | 3 refills | Status: DC

## 2020-07-26 NOTE — Progress Notes (Signed)
Gynecology Annual Exam   PCP: Patient, No Pcp Per  Chief Complaint:  Chief Complaint  Patient presents with  . Gynecologic Exam    Annual,TVUS F/U - no concerns. RM 5    History of Present Illness: Patient is a 35 y.o. [id number] presents for annual exam. The patient has no complaints today.   LMP: No LMP recorded. (Menstrual status: Oral contraceptives). Amenorrhea on norethindrone  The patient is sexually active. She currently uses oral progesterone-only contraceptive for contraception. She denies dyspareunia.  The patient does perform self breast exams.  There is no notable family history of breast or ovarian cancer in her family.  The patient wears seatbelts: yes.   The patient has regular exercise: not asked.    The patient denies current symptoms of depression.    Review of Systems: Review of Systems  Constitutional: Negative.  Negative for chills and fever.  HENT: Negative for congestion.   Respiratory: Negative for cough and shortness of breath.   Cardiovascular: Negative for chest pain and palpitations.  Gastrointestinal: Negative.  Negative for abdominal pain, constipation, diarrhea, heartburn, nausea and vomiting.  Genitourinary: Negative.  Negative for dysuria, frequency and urgency.  Skin: Negative for itching and rash.  Neurological: Negative for dizziness and headaches.  Endo/Heme/Allergies: Negative for polydipsia.  Psychiatric/Behavioral: Negative for depression.    Past Medical History:  Patient Active Problem List   Diagnosis Date Noted  . Pyelonephritis   . Sepsis due to gram-negative UTI (HCC) 06/09/2020  . Gestational hypertension 03/04/2016  . Gestational diabetes 03/04/2016  . History of cesarean delivery 03/04/2016  . Obesity affecting pregnancy in third trimester 03/04/2016  . Preeclampsia 03/04/2016    Past Surgical History:  Past Surgical History:  Procedure Laterality Date  . CESAREAN SECTION N/A 03/04/2016   Procedure: CESAREAN  SECTION;  Surgeon: Nadara Mustard, MD;  Location: [facility] ORS;  Service: Obstetrics;  Laterality: N/A;  . CESAREAN SECTION      Gynecologic History:  No LMP recorded. (Menstrual status: Oral contraceptives). Contraception: oral progesterone-only contraceptive Last Pap: Results were:06/12/2019 NIL and HR HPV negative   Obstetric History: [name]  Family History:  Family History  Problem Relation Age of Onset  . Kidney disease Mother   . Hypertension Mother   . Kidney disease Maternal Aunt   . Hypertension Maternal Aunt   . Kidney disease Maternal Grandmother   . Hypertension Maternal Grandmother     Social History:  Social History   Socioeconomic History  . Marital status: Married    Spouse name: Not on file  . Number of children: Not on file  . Years of education: Not on file  . Highest education level: Not on file  Occupational History  . Not on file  Tobacco Use  . Smoking status: Never Smoker  . Smokeless tobacco: Never Used  Vaping Use  . Vaping Use: Never used  Substance and Sexual Activity  . Alcohol use: No  . Drug use: No  . Sexual activity: Yes    Birth control/protection: Pill  Other Topics Concern  . Not on file  Social History Narrative  . Not on file   Social Determinants of Health   Financial Resource Strain: Not on file  Food Insecurity: Not on file  Transportation Needs: Not on file  Physical Activity: Not on file  Stress: Not on file  Social Connections: Not on file  Intimate Partner Violence: Not on file    Allergies:  Allergies  Allergen Reactions  .  Amlodipine Other (See Comments)    "Made blood counts off"   . Morphine And Related Itching    Medications: Prior to Admission medications   Medication Sig Start Date End Date Taking? Authorizing Provider  Acetaminophen 500 MG capsule Take 1,000 mg by mouth every 8 (eight) hours as needed for fever or pain.   Yes [provider]  losartan (COZAAR) 25 MG tablet Take 25 mg by  mouth daily. 06/26/20  Yes [provider]  metoprolol succinate (TOPROL-XL) 25 MG 24 hr tablet Take 1 tablet (25 mg total) by mouth daily. 06/12/20  Yes Vena Austria, MD  norethindrone (AYGESTIN) 5 MG tablet Take 0.5 tablets (2.5 mg total) by mouth daily. 06/12/20  Yes Vena Austria, MD  oxyCODONE-acetaminophen (PERCOCET) 7.5-325 MG tablet Take 1 tablet by mouth every 4 (four) hours as needed for moderate pain or severe pain. Patient not taking: Reported on 07/26/2020 06/11/20   Arnetha Courser, MD    Physical Exam Vitals: Blood pressure 132/90, height 5\' 4"  (1.626 m), weight 203 lb (92.1 kg).  General: NAD HEENT: normocephalic, anicteric Thyroid: no enlargement, no palpable nodules Pulmonary: No increased work of breathing, CTAB Cardiovascular: RRR, distal pulses 2+ Breast: Breast symmetrical, no tenderness, no palpable nodules or masses, no skin or nipple retraction present, no nipple discharge.  No axillary or supraclavicular lymphadenopathy. Abdomen: NABS, soft, non-tender, non-distended.  Umbilicus without lesions.  No hepatomegaly, splenomegaly or masses palpable. No evidence of hernia  Genitourinary:  External: Normal external female genitalia.  Normal urethral meatus, normal Bartholin's and Skene's glands.    Vagina: Normal vaginal mucosa, no evidence of prolapse.    Cervix: Grossly normal in appearance, no bleeding  Uterus: Non-enlarged, mobile, normal contour.  No CMT  Adnexa: ovaries non-enlarged, no adnexal masses  Rectal: deferred  Lymphatic: no evidence of inguinal lymphadenopathy Extremities: no edema, erythema, or tenderness Neurologic: Grossly intact Psychiatric: mood appropriate, affect full  Female chaperone present for pelvic and breast  portions of the physical exam  Transvaginal Non-OB  Result Date: 07/26/2020 Patient Name: Alice Harrell DOB: August 02, 1985 MRN: [id number] ULTRASOUND REPORT Location: [facility] Date of Service: 07/26/2020  Indications:Evaluate for ovarian cysts Findings: The uterus is anteverted and measures 9.3 x 4.8 x 3.6 cm. Echo texture is homogenous without evidence of focal masses. 7.9The Endometrium measures mm. Right Ovary measures 3.0 x 2.5 x 1.6 cm. It is normal in appearance. Left Ovary measures 2.9 x 2.1 x 1.8 cm. It is normal in appearance. Survey of the adnexa demonstrates no adnexal masses. There is no free fluid in the cul de sac. Impression: 1. Normal pelvic ultrasound. Recommendations: 1.Clinical correlation with the patient's History and Physical Exam. 09/23/2020, RT Images reviewed.  Normal GYN study without visualized pathology.  Deanna Artis, MD, [facility], [facility] 07/26/2020, 8:55 AM   09/23/2020 RENAL  Result Date: 07/05/2020 CLINICAL DATA:  Acute kidney injury EXAM: RENAL / URINARY TRACT ULTRASOUND COMPLETE COMPARISON:  CT 06/09/2020 FINDINGS: Right Kidney: Renal measurements: 18.7 x 10.3 x 10.6 cm = volume: 1063.6 mL. No hydronephrosis. Innumerable cysts throughout the right kidney both simple and complex. Dominant septated cyst in the mid to upper pole measures 9.3 x 8.3 x 8.6 cm. Scattered parenchymal calcifications and stones. Left Kidney: Renal measurements: 19.6 x 10.2 x 10.6 cm = volume: 1109.2 mL. No hydronephrosis. Innumerable cysts throughout the left kidney both simple and complex. Dominant upper pole cyst measures 6.7 x 6.4 x 7.1 cm. Scattered parenchymal calcifications  or stones. Bladder: Appears normal for degree of bladder distention. Other: None. IMPRESSION: 1. Enlarged polycystic kidneys without hydronephrosis. Both simple and complex cysts within both kidneys. Consider correlation with MRI. 2. Multiple scattered parenchymal calcifications as well as presumed kidney stones. Electronically Signed   By: Jasmine Pang M.D.   On: 07/05/2020 16:06    Immunization History  Administered Date(s) Administered  . Influenza,inj,Quad PF,6+ Mos 04/06/2019    Assessment: 35 y.o. E3P2951 routine annual exam  Plan: Problem List Items Addressed This Visit   None   Visit Diagnoses    Encounter for gynecological examination without abnormal finding    -  Primary   Breast screening       Surveillance for birth control, oral contraceptives          1) STI screening  was notoffered and therefore not obtained  2)  ASCCP guidelines and rational discussed.  Patient opts for every 3 years screening interval  3) Contraception - the patient is currently using  oral progesterone-only contraceptive.  She is happy with her current form of contraception and plans to continue  4) Routine healthcare maintenance including cholesterol, diabetes screening discussed managed by PCP  5) Pelvic pain - ultrasound reviewed and normal  6) Return in about 1 year (around 07/26/2021) for annual.   Vena Austria, MD, [facility], [facility] 07/26/2020, 9:27 AM

## 2021-06-05 ENCOUNTER — Encounter: Payer: Self-pay | Admitting: Obstetrics and Gynecology

## 2021-06-16 ENCOUNTER — Other Ambulatory Visit: Payer: Self-pay | Admitting: Obstetrics and Gynecology

## 2021-06-16 MED ORDER — METOPROLOL SUCCINATE ER 25 MG PO TB24: 25.0000 mg | ORAL_TABLET | Freq: Every day | ORAL | 0 refills | Status: DC

## 2021-06-16 MED ORDER — NORETHINDRONE ACETATE 5 MG PO TABS: 2.5000 mg | ORAL_TABLET | Freq: Every day | ORAL | 0 refills | Status: DC

## 2021-08-20 ENCOUNTER — Encounter: Payer: Self-pay | Admitting: Obstetrics and Gynecology

## 2021-08-20 ENCOUNTER — Other Ambulatory Visit: Payer: Self-pay

## 2021-08-20 ENCOUNTER — Ambulatory Visit (INDEPENDENT_AMBULATORY_CARE_PROVIDER_SITE_OTHER): Payer: BC Managed Care – PPO | Admitting: Obstetrics and Gynecology

## 2021-08-20 VITALS — BP 130/90 | Ht 64.0 in | Wt 225.0 lb

## 2021-08-20 DIAGNOSIS — Z01419 Encounter for gynecological examination (general) (routine) without abnormal findings: Secondary | ICD-10-CM | POA: Diagnosis not present

## 2021-08-20 DIAGNOSIS — Z3041 Encounter for surveillance of contraceptive pills: Secondary | ICD-10-CM

## 2021-08-20 MED ORDER — NORETHINDRONE ACETATE 5 MG PO TABS: 2.5000 mg | ORAL_TABLET | Freq: Every day | ORAL | 3 refills | Status: DC

## 2021-08-20 NOTE — Progress Notes (Signed)
? ?PCP:  Patient, No Pcp Per (Inactive) ? ? ?Chief Complaint  ?Patient presents with  ? Gynecologic Exam  ?  No concerns  ? ? ? ?HPI: ?     [name] is a 36 y.o. [id number] whose LMP was No LMP recorded. (Menstrual status: Oral contraceptives)., presents today for her annual examination.  Her menses are absent on aygestin, no BTB, no dysmen. Hx of amenorrhea/oligomenorrhea off BC in past.  ? ?Sex activity: single partner, contraception - oral progesterone-only contraceptive. Husband to get vasectomy and pt will then come off aygestin. ?Last Pap: 06/12/19 Results were: no abnormalities /neg HPV DNA. No hx of abn paps with tx. ? ?There is no FH of breast cancer. There is no FH of ovarian cancer. The patient does do self-breast exams. ? ?Tobacco use: The patient denies current or previous tobacco use. ?Alcohol use: none ?No drug use.  ?Exercise: moderately active ? ?She does not get adequate calcium and Vitamin D in her diet. ?FH kidney disease, pt sees nephrology. ? ?Patient Active Problem List  ? Diagnosis Date Noted  ? Pyelonephritis   ? Sepsis due to gram-negative UTI ([place]) 06/09/2020  ? Gestational hypertension 03/04/2016  ? Gestational diabetes 03/04/2016  ? History of cesarean delivery 03/04/2016  ? Obesity affecting pregnancy in third trimester 03/04/2016  ? Preeclampsia 03/04/2016  ? ? ?Past Surgical History:  ?Procedure Laterality Date  ? CESAREAN SECTION N/A 03/04/2016  ? Procedure: CESAREAN SECTION;  Surgeon: Gae Dry, MD;  Location: [facility] ORS;  Service: Obstetrics;  Laterality: N/A;  ? CESAREAN SECTION    ? ? ?Family History  ?Problem Relation Age of Onset  ? Kidney disease Mother   ? Hypertension Mother   ? Kidney disease Maternal Aunt   ? Hypertension Maternal Aunt   ? Kidney disease Maternal Grandmother   ? Hypertension Maternal Grandmother   ? ? ?Social History  ? ?Socioeconomic History  ? Marital status: Married  ?  Spouse name: Not on file  ? Number of children: Not on file  ? Years  of education: Not on file  ? Highest education level: Not on file  ?Occupational History  ? Not on file  ?Tobacco Use  ? Smoking status: Never  ? Smokeless tobacco: Never  ?Vaping Use  ? Vaping Use: Never used  ?Substance and Sexual Activity  ? Alcohol use: No  ? Drug use: No  ? Sexual activity: Yes  ?  Birth control/protection: Pill  ?Other Topics Concern  ? Not on file  ?Social History Narrative  ? Not on file  ? ?Social Determinants of Health  ? ?Financial Resource Strain: Not on file  ?Food Insecurity: Not on file  ?Transportation Needs: Not on file  ?Physical Activity: Not on file  ?Stress: Not on file  ?Social Connections: Not on file  ?Intimate Partner Violence: Not on file  ? ? ? ?Current Outpatient Medications:  ?  losartan (COZAAR) 25 MG tablet, Take 1 tablet (25 mg total) by mouth daily., Disp: 90 tablet, Rfl: 3 ?  metoprolol tartrate (LOPRESSOR) 25 MG tablet, Take 25 mg by mouth 2 (two) times daily., Disp: , Rfl:  ?  norethindrone (AYGESTIN) 5 MG tablet, Take 0.5 tablets (2.5 mg total) by mouth daily., Disp: 135 tablet, Rfl: 3 ? ? ? ? ?ROS: ? ?Review of Systems  ?Constitutional:  Negative for fatigue, fever and unexpected weight change.  ?Respiratory:  Negative for cough, shortness of breath and wheezing.   ?Cardiovascular:  Negative  for chest pain, palpitations and leg swelling.  ?Gastrointestinal:  Negative for blood in stool, constipation, diarrhea, nausea and vomiting.  ?Endocrine: Negative for cold intolerance, heat intolerance and polyuria.  ?Genitourinary:  Negative for dyspareunia, dysuria, flank pain, frequency, genital sores, hematuria, menstrual problem, pelvic pain, urgency, vaginal bleeding, vaginal discharge and vaginal pain.  ?Musculoskeletal:  Negative for back pain, joint swelling and myalgias.  ?Skin:  Negative for rash.  ?Neurological:  Negative for dizziness, syncope, light-headedness, numbness and headaches.  ?Hematological:  Negative for adenopathy.  ?Psychiatric/Behavioral:   Negative for agitation, confusion, sleep disturbance and suicidal ideas. The patient is not nervous/anxious.   ?BREAST: No symptoms ? ? ?Objective: ?BP 130/90   Ht 5\' 4"  (1.626 m)   Wt 225 lb (102.1 kg)   BMI 38.62 kg/m?  ? ? ?Physical Exam ?Constitutional:   ?   Appearance: She is well-developed.  ?Genitourinary:  ?   Vulva normal.  ?   Right Labia: No rash, tenderness or lesions. ?   Left Labia: No tenderness, lesions or rash. ?   No vaginal discharge, erythema or tenderness.  ? ?   Right Adnexa: not tender and no mass present. ?   Left Adnexa: not tender and no mass present. ?   No cervical friability or polyp.  ?   Uterus is not enlarged or tender.  ?Breasts: ?   Right: No mass, nipple discharge, skin change or tenderness.  ?   Left: No mass, nipple discharge, skin change or tenderness.  ?Neck:  ?   Thyroid: No thyromegaly.  ?Cardiovascular:  ?   Rate and Rhythm: Normal rate and regular rhythm.  ?   Heart sounds: Normal heart sounds. No murmur heard. ?Pulmonary:  ?   Effort: Pulmonary effort is normal.  ?   Breath sounds: Normal breath sounds.  ?Abdominal:  ?   Palpations: Abdomen is soft.  ?   Tenderness: There is no abdominal tenderness. There is no guarding or rebound.  ?Musculoskeletal:     ?   General: Normal range of motion.  ?   Cervical back: Normal range of motion.  ?Lymphadenopathy:  ?   Cervical: No cervical adenopathy.  ?Neurological:  ?   General: No focal deficit present.  ?   Mental Status: She is alert and oriented to person, place, and time.  ?   Cranial Nerves: No cranial nerve deficit.  ?Skin: ?   General: Skin is warm and dry.  ?Psychiatric:     ?   Mood and Affect: Mood normal.     ?   Behavior: Behavior normal.     ?   Thought Content: Thought content normal.     ?   Judgment: Judgment normal.  ?Vitals reviewed.  ? ? ?Assessment/Plan: ?Encounter for annual routine gynecological examination ? ?Encounter for surveillance of contraceptive pills - Plan: norethindrone (AYGESTIN) 5 MG tablet;  Rx RF. Pt to d/c once husband has vasectomy. Will then f/u for provera Rx if no menses Q90 days.  ? ?Meds ordered this encounter  ?Medications  ? norethindrone (AYGESTIN) 5 MG tablet  ?  Sig: Take 0.5 tablets (2.5 mg total) by mouth daily.  ?  Dispense:  135 tablet  ?  Refill:  3  ?  Order Specific Question:   Supervising Provider  ?  AnswerGae Dry [id number]  ? ?          ?GYN counsel adequate intake of calcium and vitamin D, diet and exercise ? ? ?  F/U ? Return in about 1 year (around 08/21/2022). ? ?Dyon Rotert B. Malaquias Lenker, PA-C ?08/20/2021 ?3:06 PM ?

## 2021-08-20 NOTE — Patient Instructions (Signed)
I value your feedback and you entrusting us with your care. If you get a [facility] patient survey, I would appreciate you taking the time to let us know about your experience today. Thank you! ? ? ?

## 2021-12-25 ENCOUNTER — Other Ambulatory Visit: Payer: Self-pay | Admitting: Nephrology

## 2021-12-25 DIAGNOSIS — R809 Proteinuria, unspecified: Secondary | ICD-10-CM

## 2021-12-25 DIAGNOSIS — N1831 Chronic kidney disease, stage 3a: Secondary | ICD-10-CM

## 2021-12-25 DIAGNOSIS — Q612 Polycystic kidney, adult type: Secondary | ICD-10-CM

## 2021-12-25 DIAGNOSIS — N12 Tubulo-interstitial nephritis, not specified as acute or chronic: Secondary | ICD-10-CM

## 2021-12-30 ENCOUNTER — Ambulatory Visit
Admission: RE | Admit: 2021-12-30 | Discharge: 2021-12-30 | Disposition: A | Discharge status: Home / Self Care | Payer: BC Managed Care – PPO | Source: Ambulatory Visit | Attending: Nephrology | Admitting: Nephrology

## 2021-12-30 DIAGNOSIS — N12 Tubulo-interstitial nephritis, not specified as acute or chronic: Secondary | ICD-10-CM | POA: Insufficient documentation

## 2021-12-30 DIAGNOSIS — R809 Proteinuria, unspecified: Secondary | ICD-10-CM | POA: Diagnosis present

## 2021-12-30 DIAGNOSIS — N1831 Chronic kidney disease, stage 3a: Secondary | ICD-10-CM | POA: Diagnosis present

## 2021-12-30 DIAGNOSIS — Q612 Polycystic kidney, adult type: Secondary | ICD-10-CM | POA: Diagnosis present

## 2022-07-09 ENCOUNTER — Ambulatory Visit
Admission: RE | Admit: 2022-07-09 | Discharge: 2022-07-09 | Disposition: A | Discharge status: Home / Self Care | Payer: BC Managed Care – PPO | Source: Ambulatory Visit | Attending: Urgent Care | Admitting: Urgent Care

## 2022-07-09 VITALS — BP 161/109 | HR 83 | Temp 99.5°F | Resp 17

## 2022-07-09 DIAGNOSIS — K1121 Acute sialoadenitis: Secondary | ICD-10-CM

## 2022-07-09 MED ORDER — AMOXICILLIN-POT CLAVULANATE 875-125 MG PO TABS: 1.0000 | ORAL_TABLET | Freq: Two times a day (BID) | ORAL | 0 refills | Status: DC

## 2022-07-09 NOTE — Discharge Instructions (Signed)
Follow up here or with your primary care provider if your symptoms are worsening or not improving with treatment.     

## 2022-07-09 NOTE — ED Provider Notes (Signed)
Alice Harrell    CSN: [id number] Arrival date & time: 07/09/22  0920      History   Chief Complaint Chief Complaint  Patient presents with   Facial Pain    Possible Eye infection(left eye) of some sort.  Eye is swollen and watering.  Lymph nodes around left ear are swollen causing face to be sore. - Entered by patient    HPI Alice Harrell is a 37 y.o. female.   HPI  Patient presents to urgent care with complaint of facial pain and swelling around her left eye.  She also endorses "lymph node" swelling in front of her left ear and on the left side of her posterior head.  These are becoming larger and more tender.  Reports headache, frontal and occipital.  Symptoms x 2 weeks.  She does endorse some recent history of respiratory/sinus symptoms, but denies these being severe.  Past Medical History:  Diagnosis Date   Gestational diabetes    Hypertension    Polycystic kidney disease     Patient Active Problem List   Diagnosis Date Noted   Pyelonephritis    Sepsis due to gram-negative UTI ([place]) 06/09/2020   Gestational hypertension 03/04/2016   Gestational diabetes 03/04/2016   History of cesarean delivery 03/04/2016   Obesity affecting pregnancy in third trimester 03/04/2016   Preeclampsia 03/04/2016    Past Surgical History:  Procedure Laterality Date   CESAREAN SECTION N/A 03/04/2016   Procedure: CESAREAN SECTION;  Surgeon: Gae Dry, MD;  Location: [facility] ORS;  Service: Obstetrics;  Laterality: N/A;   CESAREAN SECTION      OB History     Gravida  2   Para  2   Term  2   Preterm      AB      Living  2      SAB      IAB      Ectopic      Multiple  0   Live Births  2            Home Medications    Prior to Admission medications   Medication Sig Start Date End Date Taking? Authorizing Provider  losartan (COZAAR) 25 MG tablet Take 1 tablet (25 mg total) by mouth daily. 07/26/20   Malachy Mood, MD  metoprolol tartrate  (LOPRESSOR) 25 MG tablet Take 25 mg by mouth 2 (two) times daily.    [provider]  norethindrone (AYGESTIN) 5 MG tablet Take 0.5 tablets (2.5 mg total) by mouth daily. 2/0/25   Copland, Deirdre Evener, PA-C    Family History Family History  Problem Relation Age of Onset   Kidney disease Mother    Hypertension Mother    Kidney disease Maternal Aunt    Hypertension Maternal Aunt    Kidney disease Maternal Grandmother    Hypertension Maternal Grandmother     Social History Social History   Tobacco Use   Smoking status: Never   Smokeless tobacco: Never  Vaping Use   Vaping Use: Never used  Substance Use Topics   Alcohol use: No   Drug use: No     Allergies   Amlodipine and Morphine and related   Review of Systems Review of Systems   Physical Exam Triage Vital Signs ED Triage Vitals  Enc Vitals Group     BP      Pulse      Resp      Temp  Temp src      SpO2      Weight      Height      Head Circumference      Peak Flow      Pain Score      Pain Loc      Pain Edu?      Excl. in [place]?    No data found.  Updated Vital Signs There were no vitals taken for this visit.  Visual Acuity Right Eye Distance:   Left Eye Distance:   Bilateral Distance:    Right Eye Near:   Left Eye Near:    Bilateral Near:     Physical Exam Vitals reviewed.  Constitutional:      Appearance: Normal appearance.  HENT:     Head:      Left Ear: Tympanic membrane and ear canal normal.     Mouth/Throat:     Mouth: Mucous membranes are moist. No oral lesions.     Dentition: No dental abscesses.     Pharynx: Oropharynx is clear. No oropharyngeal exudate.     Tonsils: No tonsillar exudate.     Comments: No oral lesions or abscesses are noted.  No purulent discharge or exudate is expressed with buccal manipulation. Skin:    General: Skin is warm and dry.  Neurological:     General: No focal deficit present.     Mental Status: She is alert and oriented to person,  place, and time.  Psychiatric:        Mood and Affect: Mood normal.        Behavior: Behavior normal.      UC Treatments / Results  Labs (all labs ordered are listed, but only abnormal results are displayed) Labs Reviewed - No data to display  EKG   Radiology No results found.  Procedures Procedures (including critical care time)  Medications Ordered in UC Medications - No data to display  Initial Impression / Assessment and Plan / UC Course  I have reviewed the triage vital signs and the nursing notes.  Pertinent labs & imaging results that were available during my care of the patient were reviewed by me and considered in my medical decision making (see chart for details).   Patient is afebrile here without recent antipyretics. Satting well on room air. Overall is well appearing, well hydrated, without respiratory distress.  Lymphadenopathy in the region of the left parotid and occipital glands.  There is tenderness.  No oral lesions or abscesses are noted.  No purulent discharge observed or exudate expressed with left-sided buccal manipulation.  Unclear etiology for her symptoms-viral versus allergic versus bacterial.  Given to the duration of her symptoms, I am concerned about a possible bacterial etiology though unclear which pathogen coverage to include.  Given the patient's renal status, I am inclined to limit treatment to Augmentin and asked her to follow-up with her primary care provider if symptoms do not resolve or return for additional evaluation.  Final Clinical Impressions(s) / UC Diagnoses   Final diagnoses:  None   Discharge Instructions   None    ED Prescriptions   None    PDMP not reviewed this encounter.   Rose Phi, [place] 07/09/22 [id number]

## 2022-07-09 NOTE — ED Triage Notes (Signed)
Pt. Presents to UC w/ c/o left eye/facial swelling and a increase in B/P for the past two weeks. Pt. States she contacted her kidney doctor recently who increased her BP medication in response to her symptoms. Pt. Also endorses concern for her swollen lymph nodes on the left side of her face.  Pt. States since her symptoms started it has had an impact on her B/P.

## 2022-07-12 ENCOUNTER — Ambulatory Visit: Admit: 2022-07-12 | Payer: BC Managed Care – PPO | Source: Home / Self Care

## 2022-09-20 NOTE — Progress Notes (Unsigned)
PCP:  Marguarite Arbour, MD   No chief complaint on file.    HPI:      [name] is a 37 y.o. [id number] whose LMP was No LMP recorded. (Menstrual status: Oral contraceptives)., presents today for her annual examination.  Her menses are absent on aygestin, no BTB, no dysmen. Hx of amenorrhea/oligomenorrhea off BC in past.   Sex activity: single partner, contraception - oral progesterone-only contraceptive. Husband to get vasectomy and pt will then come off aygestin. Last Pap: 06/12/19 Results were: no abnormalities /neg HPV DNA. No hx of abn paps with tx.  There is no FH of breast cancer. There is no FH of ovarian cancer. The patient does do self-breast exams.  Tobacco use: The patient denies current or previous tobacco use. Alcohol use: none No drug use.  Exercise: moderately active  She does not get adequate calcium and Vitamin D in her diet. FH kidney disease, pt sees nephrology.  Patient Active Problem List   Diagnosis Date Noted   Pyelonephritis    Sepsis due to gram-negative UTI 06/09/2020   Gestational hypertension 03/04/2016   Gestational diabetes 03/04/2016   History of cesarean delivery 03/04/2016   Obesity affecting pregnancy in third trimester 03/04/2016   Preeclampsia 03/04/2016    Past Surgical History:  Procedure Laterality Date   CESAREAN SECTION N/A 03/04/2016   Procedure: CESAREAN SECTION;  Surgeon: Nadara Mustard, MD;  Location: [facility] ORS;  Service: Obstetrics;  Laterality: N/A;   CESAREAN SECTION      Family History  Problem Relation Age of Onset   Kidney disease Mother    Hypertension Mother    Kidney disease Maternal Aunt    Hypertension Maternal Aunt    Kidney disease Maternal Grandmother    Hypertension Maternal Grandmother     Social History   Socioeconomic History   Marital status: Married    Spouse name: Not on file   Number of children: Not on file   Years of education: Not on file   Highest education level: Not on  file  Occupational History   Not on file  Tobacco Use   Smoking status: Never   Smokeless tobacco: Never  Vaping Use   Vaping Use: Never used  Substance and Sexual Activity   Alcohol use: No   Drug use: No   Sexual activity: Yes    Birth control/protection: Pill  Other Topics Concern   Not on file  Social History Narrative   Not on file   Social Determinants of Health   Financial Resource Strain: Not on file  Food Insecurity: Not on file  Transportation Needs: Not on file  Physical Activity: Not on file  Stress: Not on file  Social Connections: Not on file  Intimate Partner Violence: Not on file     Current Outpatient Medications:    amoxicillin-clavulanate (AUGMENTIN) 875-125 MG tablet, Take 1 tablet by mouth every 12 (twelve) hours., Disp: 14 tablet, Rfl: 0   losartan (COZAAR) 25 MG tablet, Take 1 tablet (25 mg total) by mouth daily., Disp: 90 tablet, Rfl: 3   metoprolol tartrate (LOPRESSOR) 25 MG tablet, Take 25 mg by mouth 2 (two) times daily., Disp: , Rfl:    norethindrone (AYGESTIN) 5 MG tablet, Take 0.5 tablets (2.5 mg total) by mouth daily., Disp: 135 tablet, Rfl: 3     ROS:  Review of Systems  Constitutional:  Negative for fatigue, fever and unexpected weight change.  Respiratory:  Negative for cough, shortness of  breath and wheezing.   Cardiovascular:  Negative for chest pain, palpitations and leg swelling.  Gastrointestinal:  Negative for blood in stool, constipation, diarrhea, nausea and vomiting.  Endocrine: Negative for cold intolerance, heat intolerance and polyuria.  Genitourinary:  Negative for dyspareunia, dysuria, flank pain, frequency, genital sores, hematuria, menstrual problem, pelvic pain, urgency, vaginal bleeding, vaginal discharge and vaginal pain.  Musculoskeletal:  Negative for back pain, joint swelling and myalgias.  Skin:  Negative for rash.  Neurological:  Negative for dizziness, syncope, light-headedness, numbness and headaches.   Hematological:  Negative for adenopathy.  Psychiatric/Behavioral:  Negative for agitation, confusion, sleep disturbance and suicidal ideas. The patient is not nervous/anxious.    BREAST: No symptoms   Objective: There were no vitals taken for this visit.   Physical Exam Constitutional:      Appearance: She is well-developed.  Genitourinary:     Vulva normal.     Right Labia: No rash, tenderness or lesions.    Left Labia: No tenderness, lesions or rash.    No vaginal discharge, erythema or tenderness.      Right Adnexa: not tender and no mass present.    Left Adnexa: not tender and no mass present.    No cervical friability or polyp.     Uterus is not enlarged or tender.  Breasts:    Right: No mass, nipple discharge, skin change or tenderness.     Left: No mass, nipple discharge, skin change or tenderness.  Neck:     Thyroid: No thyromegaly.  Cardiovascular:     Rate and Rhythm: Normal rate and regular rhythm.     Heart sounds: Normal heart sounds. No murmur heard. Pulmonary:     Effort: Pulmonary effort is normal.     Breath sounds: Normal breath sounds.  Abdominal:     Palpations: Abdomen is soft.     Tenderness: There is no abdominal tenderness. There is no guarding or rebound.  Musculoskeletal:        General: Normal range of motion.     Cervical back: Normal range of motion.  Lymphadenopathy:     Cervical: No cervical adenopathy.  Neurological:     General: No focal deficit present.     Mental Status: She is alert and oriented to person, place, and time.     Cranial Nerves: No cranial nerve deficit.  Skin:    General: Skin is warm and dry.  Psychiatric:        Mood and Affect: Mood normal.        Behavior: Behavior normal.        Thought Content: Thought content normal.        Judgment: Judgment normal.  Vitals reviewed.     Assessment/Plan: Encounter for annual routine gynecological examination  Encounter for surveillance of contraceptive pills -  Plan: norethindrone (AYGESTIN) 5 MG tablet; Rx RF. Pt to d/c once husband has vasectomy. Will then f/u for provera Rx if no menses Q90 days.   No orders of the defined types were placed in this encounter.            GYN counsel adequate intake of calcium and vitamin D, diet and exercise     F/U  No follow-ups on file.  Syla Devoss B. Madissen Wyse, PA-C 09/20/2022 8:14 PM

## 2022-09-21 ENCOUNTER — Ambulatory Visit: Payer: BC Managed Care – PPO | Admitting: Obstetrics and Gynecology

## 2022-09-21 ENCOUNTER — Encounter: Payer: Self-pay | Admitting: Obstetrics and Gynecology

## 2022-09-21 ENCOUNTER — Ambulatory Visit (INDEPENDENT_AMBULATORY_CARE_PROVIDER_SITE_OTHER): Payer: BC Managed Care – PPO | Admitting: Obstetrics and Gynecology

## 2022-09-21 VITALS — BP 120/80 | Ht 65.0 in | Wt 221.0 lb

## 2022-09-21 DIAGNOSIS — Z3041 Encounter for surveillance of contraceptive pills: Secondary | ICD-10-CM

## 2022-09-21 DIAGNOSIS — Z01419 Encounter for gynecological examination (general) (routine) without abnormal findings: Secondary | ICD-10-CM | POA: Diagnosis not present

## 2022-09-21 MED ORDER — NORETHINDRONE ACETATE 5 MG PO TABS: 2.5000 mg | ORAL_TABLET | Freq: Every day | ORAL | 3 refills | Status: DC

## 2022-09-21 NOTE — Patient Instructions (Signed)
I value your feedback and you entrusting us with your care. If you get a [facility] patient survey, I would appreciate you taking the time to let us know about your experience today. Thank you! ? ? ?

## 2022-10-19 IMAGING — CT CT RENAL STONE PROTOCOL
2 of 4 series · 15 of 46 positions shown, 17 images · non-contrast
Comparison: CT dated February 22, 2007

CLINICAL DATA: Flank pain. Kidney stones suspected. Lower abdominal
pressure with pain.

EXAM:
CT ABDOMEN AND PELVIS WITHOUT CONTRAST
TECHNIQUE: Multidetector CT imaging of the abdomen and pelvis was performed
following the standard protocol without IV contrast.

[Series 2: stone full standard · axial · 0.81mm/px · z∈[-1040,-570]mm · 12 of 108 slices shown, 14 images]
[im 9/108  soft-tissue]
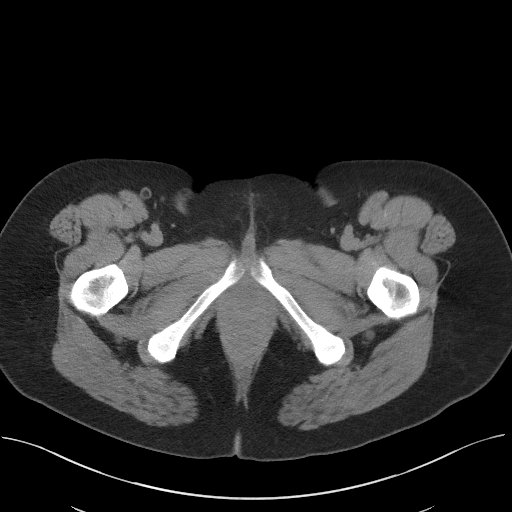
[im 9/108  bone]
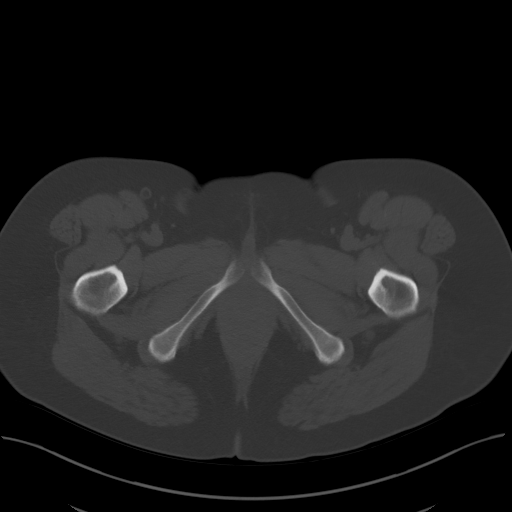
[im 18/108  soft-tissue]
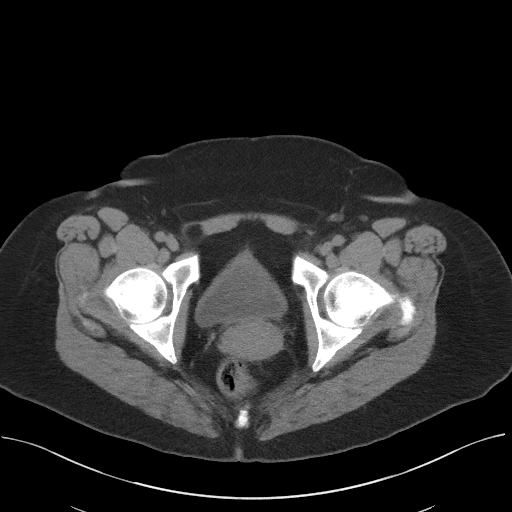
[im 26/108  soft-tissue]
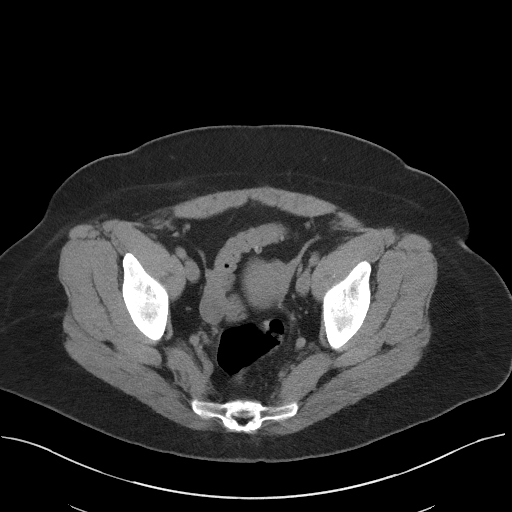
[im 35/108  soft-tissue]
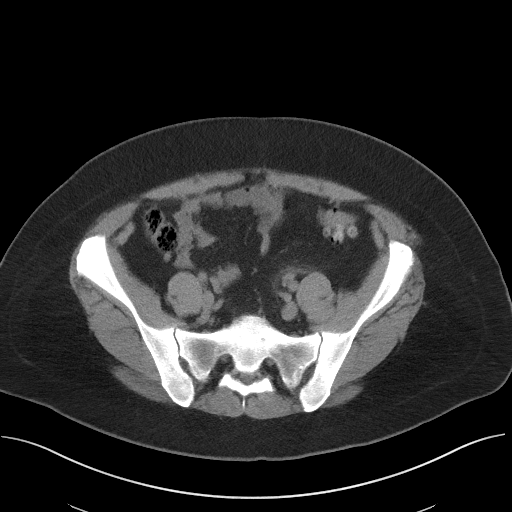
[im 43/108  soft-tissue]
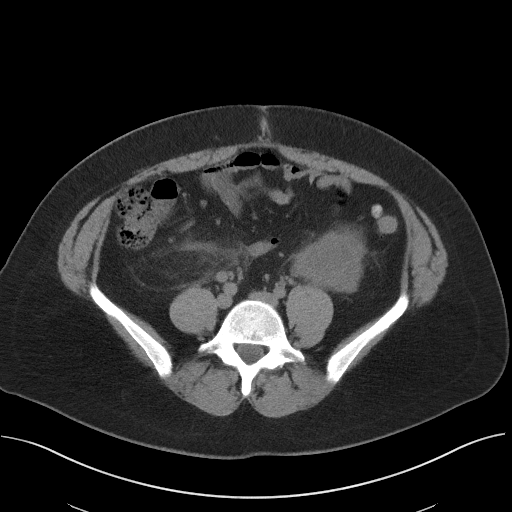
[im 52/108  soft-tissue]
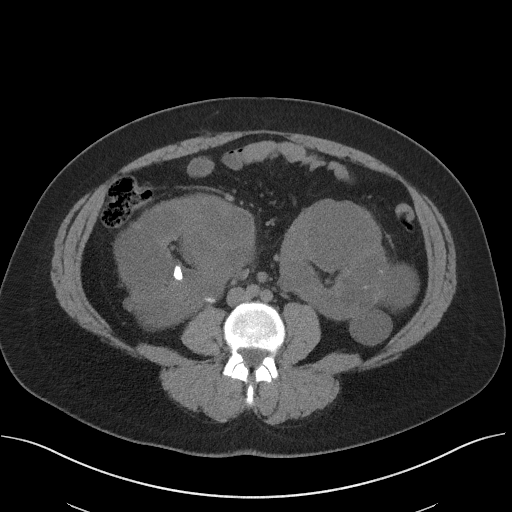
[im 60/108  soft-tissue]
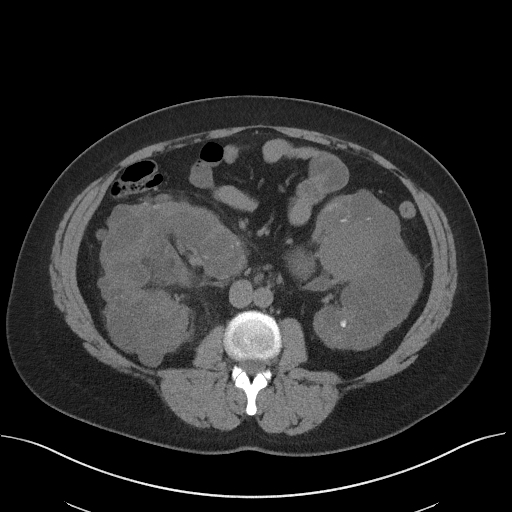
[im 69/108  soft-tissue]
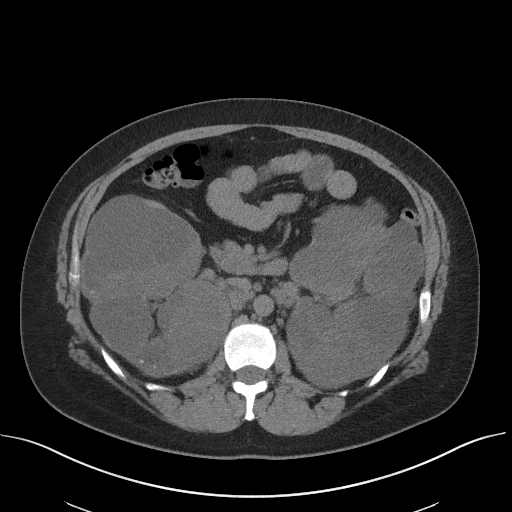
[im 78/108  soft-tissue]
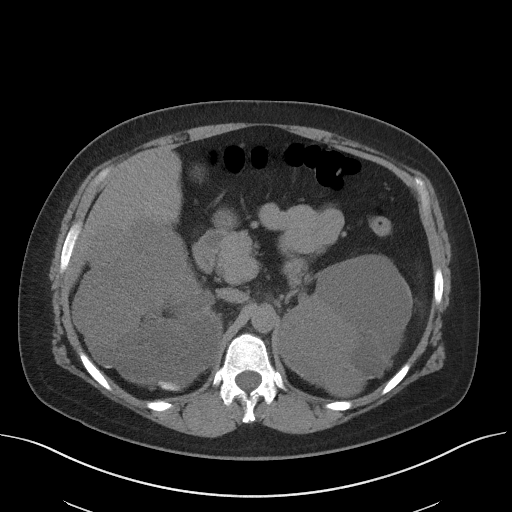
[im 78/108  bone]
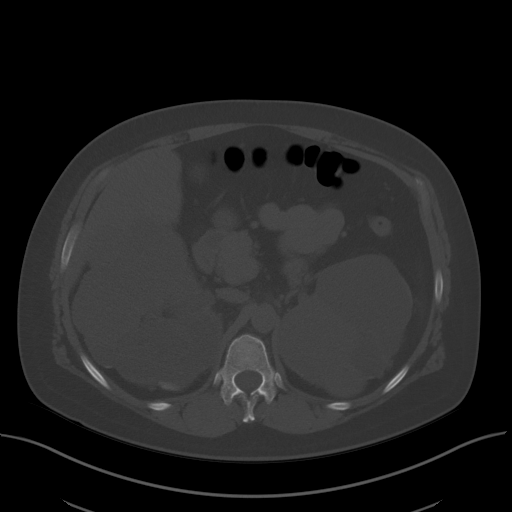
[im 86/108  soft-tissue]
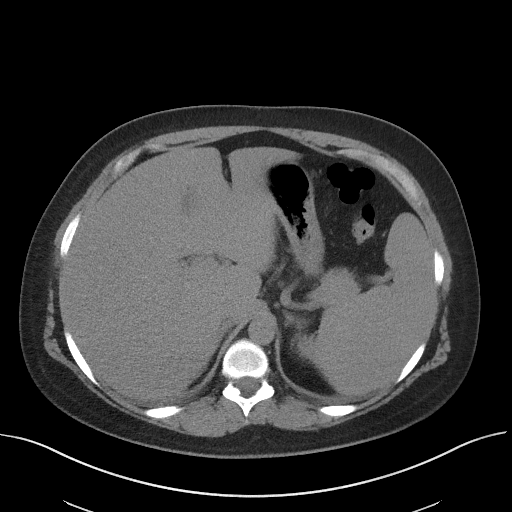
[im 95/108  soft-tissue]
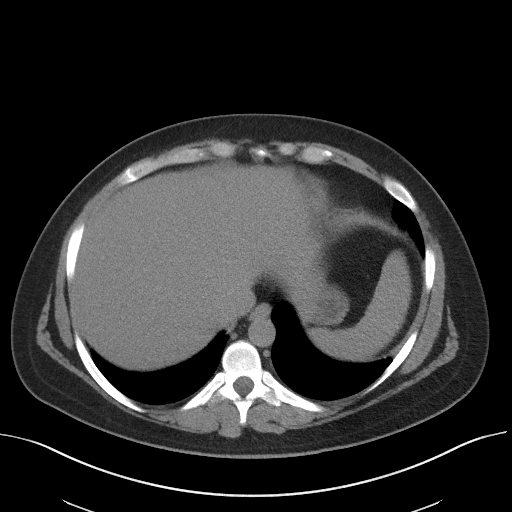
[im 103/108  soft-tissue]
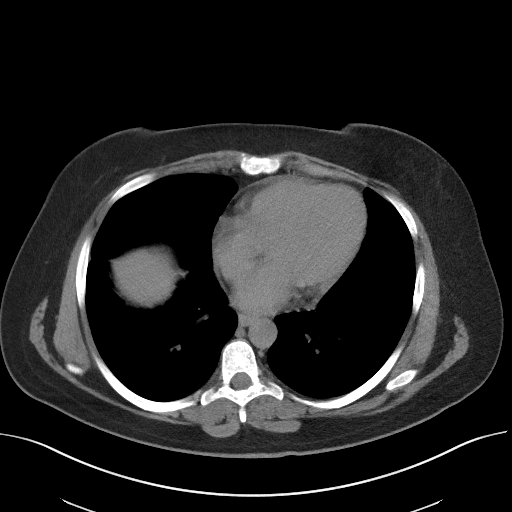

[Series 5: coronal · coronal · 0.77mm/px · 3 of 147 slices shown]
[im 49/147  soft-tissue]
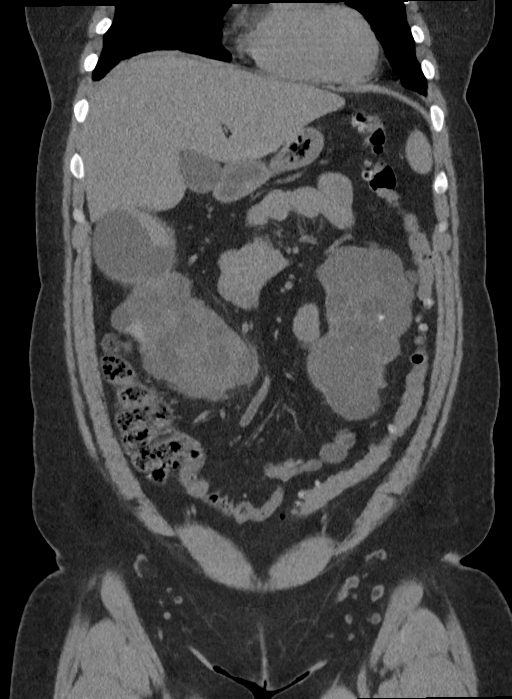
[im 65/147  soft-tissue]
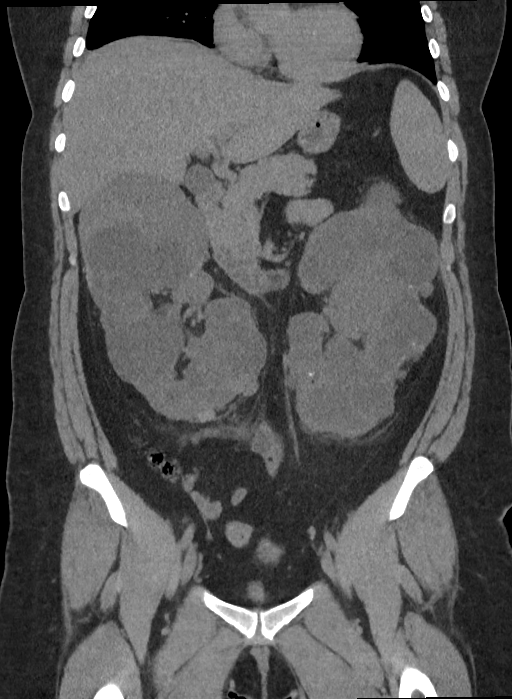
[im 82/147  soft-tissue]
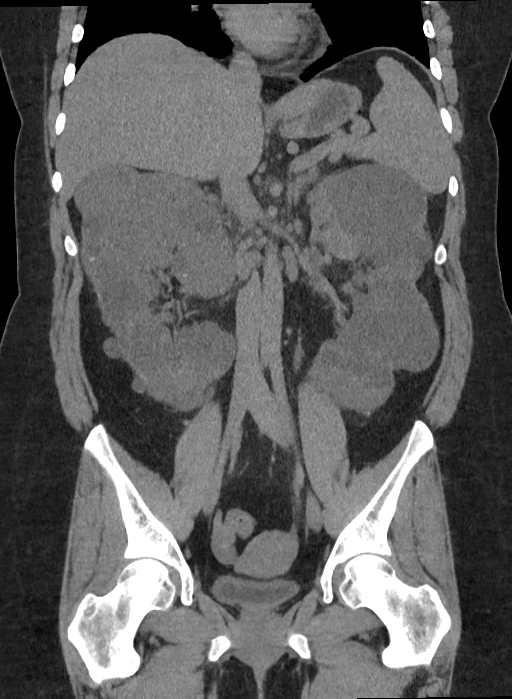

[15 of 46 positions shown; findings below may reference images not displayed]

FINDINGS: Lower chest: There are trace bilateral pleural effusions.The heart
size is normal.

Hepatobiliary: There is decreased hepatic attenuation suggestive of
hepatic steatosis. Normal gallbladder.There is no biliary ductal
dilation.

Pancreas: Normal contours without ductal dilatation. No
peripancreatic fluid collection.

Spleen: Unremarkable.

Adrenals/Urinary Tract:

--Adrenal glands: Unremarkable.

--Right kidney/ureter: Innumerable large cysts are noted throughout
the right kidney, some of which are hyperdense. There is a large
nonobstructing stone in the lower pole. Smaller nonobstructing
stones are noted. The right kidney is overall enlarged.

--Left kidney/ureter: Multiple large simple and complex cysts are
noted throughout the left kidney. There is no left-sided
hydronephrosis. There are few nonobstructing stones. The left kidney
is overall enlarged.

--Urinary bladder: Unremarkable.

Stomach/Bowel:

--Stomach/Duodenum: No hiatal hernia or other gastric abnormality.
Normal duodenal course and caliber.

--Small bowel: Unremarkable.

--Colon: Rectosigmoid diverticulosis without acute inflammation.

--Appendix: Normal.

Vascular/Lymphatic: Normal course and caliber of the major abdominal
vessels.

--there are mildly enlarged retroperitoneal lymph nodes, likely
reactive.

--No mesenteric lymphadenopathy.

--No pelvic or inguinal lymphadenopathy.

Reproductive: There are complex appearing bilateral ovarian masses.
On the right the mass measures approximately 2.2 cm. On the left it
measures 2.4 cm.

Other: No ascites or free air. The abdominal wall is normal.

Musculoskeletal. No acute displaced fractures.
IMPRESSION: 1. Bilateral nonobstructing nephrolithiasis.
2. Findings are consistent with polycystic kidney disease. There are
indeterminate bilateral renal nodules, all favored to represent
proteinaceous or hemorrhagic cysts. Consider further evaluation with
an outpatient renal ultrasound or MRI.
3. Hepatic steatosis.
4. Rectosigmoid diverticulosis without acute inflammation.
5. Mildly enlarged retroperitoneal lymph nodes, likely reactive.
6. Complex appearing bilateral ovarian masses. These are favored to
represent hemorrhagic cysts. Follow-up with an outpatient pelvic
ultrasound in 6-12 weeks is recommended for further evaluation of
both lesions.
7. Trace bilateral pleural effusions.

## 2022-12-23 ENCOUNTER — Encounter: Payer: Self-pay | Admitting: Obstetrics and Gynecology

## 2023-01-07 ENCOUNTER — Other Ambulatory Visit: Payer: Self-pay | Admitting: Nephrology

## 2023-01-07 ENCOUNTER — Encounter: Payer: Self-pay | Admitting: Nephrology

## 2023-01-07 DIAGNOSIS — N12 Tubulo-interstitial nephritis, not specified as acute or chronic: Secondary | ICD-10-CM

## 2023-01-07 DIAGNOSIS — R809 Proteinuria, unspecified: Secondary | ICD-10-CM

## 2023-01-07 DIAGNOSIS — N1832 Chronic kidney disease, stage 3b: Secondary | ICD-10-CM

## 2023-01-07 DIAGNOSIS — E663 Overweight: Secondary | ICD-10-CM

## 2023-01-07 DIAGNOSIS — N281 Cyst of kidney, acquired: Secondary | ICD-10-CM

## 2023-01-07 DIAGNOSIS — N1831 Chronic kidney disease, stage 3a: Secondary | ICD-10-CM

## 2023-01-07 DIAGNOSIS — Q613 Polycystic kidney, unspecified: Secondary | ICD-10-CM

## 2023-01-07 DIAGNOSIS — I10 Essential (primary) hypertension: Secondary | ICD-10-CM

## 2023-01-13 ENCOUNTER — Other Ambulatory Visit: Payer: Self-pay | Admitting: Nephrology

## 2023-01-13 ENCOUNTER — Ambulatory Visit
Admission: RE | Admit: 2023-01-13 | Discharge: 2023-01-13 | Disposition: A | Discharge status: Home / Self Care | Payer: BC Managed Care – PPO | Source: Ambulatory Visit | Attending: Nephrology | Admitting: Nephrology

## 2023-01-13 DIAGNOSIS — N1832 Chronic kidney disease, stage 3b: Secondary | ICD-10-CM | POA: Insufficient documentation

## 2023-01-13 DIAGNOSIS — N281 Cyst of kidney, acquired: Secondary | ICD-10-CM

## 2023-01-13 DIAGNOSIS — Q613 Polycystic kidney, unspecified: Secondary | ICD-10-CM

## 2023-01-13 DIAGNOSIS — R809 Proteinuria, unspecified: Secondary | ICD-10-CM | POA: Insufficient documentation

## 2023-01-13 DIAGNOSIS — E663 Overweight: Secondary | ICD-10-CM | POA: Insufficient documentation

## 2023-01-13 DIAGNOSIS — I10 Essential (primary) hypertension: Secondary | ICD-10-CM | POA: Insufficient documentation

## 2023-01-13 DIAGNOSIS — N12 Tubulo-interstitial nephritis, not specified as acute or chronic: Secondary | ICD-10-CM

## 2023-10-26 ENCOUNTER — Ambulatory Visit (INDEPENDENT_AMBULATORY_CARE_PROVIDER_SITE_OTHER): Admitting: Obstetrics and Gynecology

## 2023-10-26 ENCOUNTER — Other Ambulatory Visit (HOSPITAL_COMMUNITY)
Admission: RE | Admit: 2023-10-26 | Discharge: 2023-10-26 | Disposition: A | Discharge status: Home / Self Care | Source: Ambulatory Visit | Attending: Obstetrics and Gynecology | Admitting: Obstetrics and Gynecology

## 2023-10-26 ENCOUNTER — Encounter: Payer: Self-pay | Admitting: Obstetrics and Gynecology

## 2023-10-26 VITALS — BP 122/84 | HR 69 | Ht 64.0 in | Wt 221.0 lb

## 2023-10-26 DIAGNOSIS — Z01419 Encounter for gynecological examination (general) (routine) without abnormal findings: Secondary | ICD-10-CM | POA: Diagnosis not present

## 2023-10-26 DIAGNOSIS — Z1151 Encounter for screening for human papillomavirus (HPV): Secondary | ICD-10-CM | POA: Diagnosis present

## 2023-10-26 DIAGNOSIS — Z124 Encounter for screening for malignant neoplasm of cervix: Secondary | ICD-10-CM | POA: Insufficient documentation

## 2023-10-26 DIAGNOSIS — Z3041 Encounter for surveillance of contraceptive pills: Secondary | ICD-10-CM

## 2023-10-26 MED ORDER — NORETHINDRONE ACETATE 5 MG PO TABS: 2.5000 mg | ORAL_TABLET | Freq: Every day | ORAL | 3 refills | Status: AC

## 2023-10-26 NOTE — Progress Notes (Signed)
 PCP:  Yehuda Helms, MD   Chief Complaint  Patient presents with   Gynecologic Exam    No concerns     HPI:      Ms. Alice  PETTY Harrell is a 38 y.o. [id number] whose LMP was No LMP recorded. (Menstrual status: Oral contraceptives)., presents today for her annual examination.  Her menses are absent on aygestin  2.5 mg dose, no BTB, no dysmen. Had increased BP with OCPs; insurance didn't cover slynd  so put on aygestin  by Dr. Clemetine Cypher. Hx of amenorrhea/oligomenorrhea off BC in past.   Sex activity: single partner, contraception - oral progesterone-only contraceptive. Husband to get vasectomy and pt will then come off aygestin .  Last Pap: 06/12/19 Results were: no abnormalities /neg HPV DNA. No hx of abn paps with tx.  There is no FH of breast cancer. There is no FH of ovarian cancer. The patient does do self-breast exams.  Tobacco use: The patient denies current or previous tobacco use. Alcohol use: none No drug use.  Exercise: moderately active  She does not get adequate calcium or Vitamin D in her diet. FH kidney disease/kidney stones, pt sees nephrology.  Patient Active Problem List   Diagnosis Date Noted   Pyelonephritis    Sepsis due to gram-negative UTI (HCC) 06/09/2020   Gestational hypertension 03/04/2016   Gestational diabetes 03/04/2016   History of cesarean delivery 03/04/2016   Obesity affecting pregnancy in third trimester 03/04/2016   Preeclampsia 03/04/2016    Past Surgical History:  Procedure Laterality Date   CESAREAN SECTION N/A 03/04/2016   Procedure: CESAREAN SECTION;  Surgeon: Alben Alma, MD;  Location: [facility] ORS;  Service: Obstetrics;  Laterality: N/A;   CESAREAN SECTION      Family History  Problem Relation Age of Onset   Kidney disease Mother    Hypertension Mother    Kidney disease Maternal Aunt    Hypertension Maternal Aunt    Kidney disease Maternal Grandmother    Hypertension Maternal Grandmother     Social History    Socioeconomic History   Marital status: Married    Spouse name: Not on file   Number of children: Not on file   Years of education: Not on file   Highest education level: Not on file  Occupational History   Not on file  Tobacco Use   Smoking status: Never   Smokeless tobacco: Never  Vaping Use   Vaping status: Never Used  Substance and Sexual Activity   Alcohol use: No   Drug use: No   Sexual activity: Yes    Birth control/protection: Pill  Other Topics Concern   Not on file  Social History Narrative   Not on file   Social Drivers of Health   Financial Resource Strain: Low Risk  (12/24/2022)   Received [facility]   Overall Financial Resource Strain (CARDIA)    Difficulty of Paying Living Expenses: Not hard at all  Food Insecurity: No Food Insecurity (12/24/2022)   Received from [facility]   Hunger Vital Sign    Worried About Running Out of Food in the Last Year: Never true    Ran Out of Food in the Last Year: Never true  Transportation Needs: No Transportation Needs (12/24/2022)   Received from [facility] - Transportation    In the past 12 months, has lack of transportation kept you from medical appointments or from getting medications?: No    Lack of  Transportation (Non-Medical): No  Physical Activity: Not on file  Stress: Not on file  Social Connections: Not on file  Intimate Partner Violence: Not on file     Current Outpatient Medications:    Acetaminophen  500 MG capsule, Take by mouth., Disp: , Rfl:    JYNARQUE 30 & 15 MG TBPK, Take by mouth., Disp: , Rfl:    metoprolol  tartrate (LOPRESSOR ) 25 MG tablet, Take 25 mg by mouth 2 (two) times daily., Disp: , Rfl:    rosuvastatin (CRESTOR) 20 MG tablet, Take 20 mg by mouth at bedtime., Disp: , Rfl:    losartan  (COZAAR ) 50 MG tablet, Take by mouth., Disp: , Rfl:    norethindrone  (AYGESTIN ) 5 MG tablet, Take 0.5 tablets (2.5 mg total) by mouth  daily., Disp: 45 tablet, Rfl: 3     ROS:  Review of Systems  Constitutional:  Negative for fatigue, fever and unexpected weight change.  Respiratory:  Negative for cough, shortness of breath and wheezing.   Cardiovascular:  Negative for chest pain, palpitations and leg swelling.  Gastrointestinal:  Negative for blood in stool, constipation, diarrhea, nausea and vomiting.  Endocrine: Negative for cold intolerance, heat intolerance and polyuria.  Genitourinary:  Negative for dyspareunia, dysuria, flank pain, frequency, genital sores, hematuria, menstrual problem, pelvic pain, urgency, vaginal bleeding, vaginal discharge and vaginal pain.  Musculoskeletal:  Negative for back pain, joint swelling and myalgias.  Skin:  Negative for rash.  Neurological:  Negative for dizziness, syncope, light-headedness, numbness and headaches.  Hematological:  Negative for adenopathy.  Psychiatric/Behavioral:  Negative for agitation, confusion, sleep disturbance and suicidal ideas. The patient is not nervous/anxious.    BREAST: No symptoms   Objective: BP 122/84   Pulse 69   Ht 5\' 4"  (1.626 m)   Wt 221 lb (100.2 kg)   BMI 37.93 kg/m    Physical Exam Constitutional:      Appearance: She is well-developed.  Genitourinary:     Vulva normal.     Right Labia: No rash, tenderness or lesions.    Left Labia: No tenderness, lesions or rash.    No vaginal discharge, erythema or tenderness.      Right Adnexa: not tender and no mass present.    Left Adnexa: not tender and no mass present.    No cervical friability or polyp.     Uterus is not enlarged or tender.  Breasts:    Right: No mass, nipple discharge, skin change or tenderness.     Left: No mass, nipple discharge, skin change or tenderness.  Neck:     Thyroid: No thyromegaly.  Cardiovascular:     Rate and Rhythm: Normal rate and regular rhythm.     Heart sounds: Normal heart sounds. No murmur heard. Pulmonary:     Effort: Pulmonary effort  is normal.     Breath sounds: Normal breath sounds.  Abdominal:     Palpations: Abdomen is soft.     Tenderness: There is no abdominal tenderness. There is no guarding or rebound.  Musculoskeletal:        General: Normal range of motion.     Cervical back: Normal range of motion.  Lymphadenopathy:     Cervical: No cervical adenopathy.  Neurological:     General: No focal deficit present.     Mental Status: She is alert and oriented to person, place, and time.     Cranial Nerves: No cranial nerve deficit.  Skin:    General: Skin is warm and dry.  Psychiatric:        Mood and Affect: Mood normal.        Behavior: Behavior normal.        Thought Content: Thought content normal.        Judgment: Judgment normal.  Vitals reviewed.     Assessment/Plan:  Encounter for annual routine gynecological examination  Cervical cancer screening - Plan: Cytology - PAP  Screening for HPV (human papillomavirus) - Plan: Cytology - PAP  Encounter for surveillance of contraceptive pills - Plan: norethindrone  (AYGESTIN ) 5 MG tablet; takes 1/2 tab. Rx RF eRxd. Calcium in diet/ add Vit D supp if ok with nephrology.   Meds ordered this encounter  Medications   norethindrone  (AYGESTIN ) 5 MG tablet    Sig: Take 0.5 tablets (2.5 mg total) by mouth daily.    Dispense:  45 tablet    Refill:  3    Supervising Provider:   ROBY, MICIA [[id number]]            GYN counsel adequate intake of calcium and vitamin D, diet and exercise     F/U  Return in about 1 year (around 10/25/2024).  Kobey Sides B. Chyler Creely, PA-C 10/26/2023 4:14 PM

## 2023-10-26 NOTE — Patient Instructions (Signed)
 I value your feedback and you entrusting [place] with your care. If you get a [place] patient survey, I would appreciate you taking the time to let us know about your experience today. Thank you! ? ? ?

## 2023-10-28 LAB — CYTOLOGY - PAP
Adequacy: ABSENT
Comment: NEGATIVE
Diagnosis: NEGATIVE
High risk HPV: NEGATIVE

## 2023-11-07 ENCOUNTER — Other Ambulatory Visit: Payer: Self-pay | Admitting: Obstetrics and Gynecology

## 2023-11-07 DIAGNOSIS — Z3041 Encounter for surveillance of contraceptive pills: Secondary | ICD-10-CM

## 2024-01-01 ENCOUNTER — Other Ambulatory Visit: Payer: Self-pay | Admitting: Obstetrics and Gynecology

## 2024-01-01 DIAGNOSIS — Z3041 Encounter for surveillance of contraceptive pills: Secondary | ICD-10-CM
